# Patient Record
Sex: Female | Born: 1945 | Race: White | Hispanic: No | Smoking: Never smoker
Health system: Southern US, Community
[De-identification: ages and names within clinical notes are randomized; demographics above are authoritative.]

## PROBLEM LIST (undated history)

## (undated) DIAGNOSIS — E785 Hyperlipidemia, unspecified: Secondary | ICD-10-CM

## (undated) DIAGNOSIS — Q782 Osteopetrosis: Secondary | ICD-10-CM

## (undated) DIAGNOSIS — U071 COVID-19: Secondary | ICD-10-CM

## (undated) DIAGNOSIS — N814 Uterovaginal prolapse, unspecified: Secondary | ICD-10-CM

## (undated) DIAGNOSIS — M21619 Bunion of unspecified foot: Secondary | ICD-10-CM

## (undated) HISTORY — DX: Bunion of unspecified foot: M21.619

## (undated) HISTORY — DX: Uterovaginal prolapse, unspecified: N81.4

## (undated) HISTORY — DX: Hyperlipidemia, unspecified: E78.5

## (undated) HISTORY — PX: BREAST BIOPSY: SHX20

## (undated) HISTORY — PX: TUBAL LIGATION: SHX77

## (undated) HISTORY — PX: CATARACT EXTRACTION: SUR2

---

## 2014-08-30 ENCOUNTER — Ambulatory Visit (INDEPENDENT_AMBULATORY_CARE_PROVIDER_SITE_OTHER): Payer: Medicare PPO | Admitting: Physician Assistant

## 2014-08-30 ENCOUNTER — Encounter: Payer: Self-pay | Admitting: Physician Assistant

## 2014-08-30 VITALS — BP 125/75 | Ht 64.0 in | Wt 131.0 lb

## 2014-08-30 DIAGNOSIS — Z13820 Encounter for screening for osteoporosis: Secondary | ICD-10-CM

## 2014-08-30 DIAGNOSIS — N8110 Cystocele, unspecified: Secondary | ICD-10-CM | POA: Diagnosis not present

## 2014-08-30 DIAGNOSIS — M2012 Hallux valgus (acquired), left foot: Secondary | ICD-10-CM | POA: Diagnosis not present

## 2014-08-30 DIAGNOSIS — M1990 Unspecified osteoarthritis, unspecified site: Secondary | ICD-10-CM | POA: Diagnosis not present

## 2014-08-30 DIAGNOSIS — Z1239 Encounter for other screening for malignant neoplasm of breast: Secondary | ICD-10-CM | POA: Diagnosis not present

## 2014-08-30 DIAGNOSIS — IMO0002 Reserved for concepts with insufficient information to code with codable children: Secondary | ICD-10-CM | POA: Insufficient documentation

## 2014-08-30 DIAGNOSIS — M8580 Other specified disorders of bone density and structure, unspecified site: Secondary | ICD-10-CM

## 2014-08-30 DIAGNOSIS — M21612 Bunion of left foot: Secondary | ICD-10-CM

## 2014-08-30 NOTE — Patient Instructions (Addendum)
caltrate-D  About Cystocele  Overview  The pelvic organs, including the bladder, are normally supported by pelvic floor muscles and ligaments.  When these muscles and ligaments are stretched, weakened or torn, the wall between the bladder and the vagina sags or herniates causing a prolapse, sometimes called a cystocele.  This condition may cause discomfort and problems with emptying the bladder.  It can be present in various stages.  Some people are not aware of the changes.  Others may notice changes at the vaginal opening or a feeling of the bladder dropping outside the body.  Causes of a Cystocele  A cystocele is usually caused by muscle straining or stretching during childbirth.  In addition, cystocele is more common after menopause, because the hormone estrogen helps keep the elastic tissues around the pelvic organs strong.  A cystocele is more likely to occur when levels of estrogen decrease.  Other causes include: heavy lifting, chronic coughing, previous pelvic surgery and obesity.  Symptoms  A bladder that has dropped from its normal position may cause: unwanted urine leakage (stress incontinence), frequent urination or urge to urinate, incomplete emptying of the bladder (not feeling bladder relief after emptying), pain or discomfort in the vagina, pelvis, groin, lower back or lower abdomen and frequent urinary tract infections.  Mild cases may not cause any symptoms.  Treatment Options  Pelvic floor (Kegel) exercises:  Strength training the muscles in your genital area  Behavioral changes: Treating and preventing constipation, taking time to empty your bladder properly, learning to lift properly and/or avoid heavy lifting when possible, stopping smoking, avoiding weight gain and treating a chronic cough or bronchitis.  A pessary: A vaginal support device is sometimes used to help pelvic support caused by muscle and ligament changes.  Surgery: Surgical repair may be necessary if  symptoms cannot be managed with exercise, behavioral changes and a pessary.  Surgery is usually considered for severe cases.   2007, [employer]  Herpes Zoster Virus Vaccine What is this medicine? HERPES ZOSTER VIRUS VACCINE (HUR peez ZOS ter vahy ruhs vak SEEN) is a vaccine. It is used to prevent shingles in adults 67 years old and over. This vaccine is not used to treat shingles or nerve pain from shingles. This medicine may be used for other purposes; ask your health care provider or pharmacist if you have questions. COMMON BRAND NAME(S): Varivax, Zostavax What should I tell my health care provider before I take this medicine? They need to know if you have any of these conditions: -cancer like leukemia or lymphoma -immune system problems or therapy -infection with fever -tuberculosis -an unusual or allergic reaction to vaccines, neomycin, gelatin, other medicines, foods, dyes, or preservatives -pregnant or trying to get pregnant -breast-feeding How should I use this medicine? This vaccine is for injection under the skin. It is given by a health care professional. Talk to your pediatrician regarding the use of this medicine in children. This medicine is not approved for use in children. Overdosage: If you think you have taken too much of this medicine contact a poison control center or emergency room at once. NOTE: This medicine is only for you. Do not share this medicine with others. What if I miss a dose? This does not apply. What may interact with this medicine? Do not take this medicine with any of the following medications: -adalimumab -anakinra -etanercept -infliximab -medicines to treat cancer -medicines that suppress your immune system This medicine may also interact with the following medications: -immunoglobulins -steroid medicines  like prednisone or cortisone This list may not describe all possible interactions. Give your health care provider a list of  all the medicines, herbs, non-prescription drugs, or dietary supplements you use. Also tell them if you smoke, drink alcohol, or use illegal drugs. Some items may interact with your medicine. What should I watch for while using this medicine? Visit your doctor for regular check ups. This vaccine, like all vaccines, may not fully protect everyone. After receiving this vaccine it may be possible to pass chickenpox infection to others. Avoid people with immune system problems, pregnant women who have not had chickenpox, and newborns of women who have not had chickenpox. Talk to your doctor for more information. What side effects may I notice from receiving this medicine? Side effects that you should report to your doctor or health care professional as soon as possible: -allergic reactions like skin rash, itching or hives, swelling of the face, lips, or tongue -breathing problems -feeling faint or lightheaded, falls -fever, flu-like symptoms -pain, tingling, numbness in the hands or feet -swelling of the ankles, feet, hands -unusually weak or tired Side effects that usually do not require medical attention (report to your doctor or health care professional if they continue or are bothersome): -aches or pains -chickenpox-like rash -diarrhea -headache -loss of appetite -nausea, vomiting -redness, pain, swelling at site where injected -runny nose This list may not describe all possible side effects. Call your doctor for medical advice about side effects. You may report side effects to FDA at 1-800-FDA-1088. Where should I keep my medicine? This drug is given in a hospital or clinic and will not be stored at home. NOTE: This sheet is a summary. It may not cover all possible information. If you have questions about this medicine, talk to your doctor, pharmacist, or health care provider.  2015, [employer]. (2009-11-06 17:43:50) Pneumococcal Vaccine, Polyvalent suspension for  injection What is this medicine? PNEUMOCOCCAL VACCINE, POLYVALENT (NEU mo KOK al vak SEEN, pol ee VEY luhnt) is a vaccine to prevent pneumococcus bacteria infection. These bacteria are a major cause of ear infections, 'Strep throat' infections, and serious pneumonia, meningitis, or blood infections worldwide. These vaccines help the body to produce antibodies (protective substances) that help your body defend against these bacteria. This vaccine is recommended for infants and young children. This vaccine will not treat an infection. This medicine may be used for other purposes; ask your health care provider or pharmacist if you have questions. COMMON BRAND NAME(S): Prevnar 13 What should I tell my health care provider before I take this medicine? They need to know if you have any of these conditions: -bleeding problems -fever -immune system problems -low platelet count in the blood -seizures -an unusual or allergic reaction to pneumococcal vaccine, diphtheria toxoid, other vaccines, latex, other medicines, foods, dyes, or preservatives -pregnant or trying to get pregnant -breast-feeding How should I use this medicine? This vaccine is for injection into a muscle. It is given by a health care professional. A copy of Vaccine Information Statements will be given before each vaccination. Read this sheet carefully each time. The sheet may change frequently. Talk to your pediatrician regarding the use of this medicine in children. While this drug may be prescribed for children as young as 72 weeks old for selected conditions, precautions do apply. Overdosage: If you think you have taken too much of this medicine contact a poison control center or emergency room at once. NOTE: This medicine is only for you. Do not share  this medicine with others. What if I miss a dose? It is important not to miss your dose. Call your doctor or health care professional if you are unable to keep an appointment. What may  interact with this medicine? -medicines for cancer chemotherapy -medicines that suppress your immune function -medicines that treat or prevent blood clots like warfarin, enoxaparin, and dalteparin -steroid medicines like prednisone or cortisone This list may not describe all possible interactions. Give your health care provider a list of all the medicines, herbs, non-prescription drugs, or dietary supplements you use. Also tell them if you smoke, drink alcohol, or use illegal drugs. Some items may interact with your medicine. What should I watch for while using this medicine? Mild fever and pain should go away in 3 days or less. Report any unusual symptoms to your doctor or health care professional. What side effects may I notice from receiving this medicine? Side effects that you should report to your doctor or health care professional as soon as possible: -allergic reactions like skin rash, itching or hives, swelling of the face, lips, or tongue -breathing problems -confused -fever over 102 degrees F -pain, tingling, numbness in the hands or feet -seizures -unusual bleeding or bruising -unusual muscle weakness Side effects that usually do not require medical attention (report to your doctor or health care professional if they continue or are bothersome): -aches and pains -diarrhea -fever of 102 degrees F or less -headache -irritable -loss of appetite -pain, tender at site where injected -trouble sleeping This list may not describe all possible side effects. Call your doctor for medical advice about side effects. You may report side effects to FDA at 1-800-FDA-1088. Where should I keep my medicine? This does not apply. This vaccine is given in a clinic, pharmacy, doctor's office, or other health care setting and will not be stored at home. NOTE: This sheet is a summary. It may not cover all possible information. If you have questions about this medicine, talk to your doctor,  pharmacist, or health care provider.  2015, [employer]. (2008-08-02 10:17:22)

## 2014-09-02 ENCOUNTER — Telehealth: Payer: Self-pay | Admitting: Physician Assistant

## 2014-09-02 DIAGNOSIS — M21612 Bunion of left foot: Secondary | ICD-10-CM | POA: Insufficient documentation

## 2014-09-02 NOTE — Progress Notes (Signed)
   Subjective:    Patient ID: Connie Burke, female    DOB: December 21, 1945, 70 y.o.   MRN: [id number]  HPI Patient is a 69 year old female who presents to the clinic to establish care today.  .. Active Ambulatory Problems    Diagnosis Date Noted  . Cystocele 08/30/2014  . Bunion, left foot 09/02/2014   Resolved Ambulatory Problems    Diagnosis Date Noted  . No Resolved Ambulatory Problems   No Additional Past Medical History   .Marland Kitchen Family History  Problem Relation Age of Onset  . Hypertension Mother   . Arthritis Mother   . Cancer Maternal Aunt     breast   .. History   Social History  . Marital Status: Single    Spouse Name: N/A  . Number of Children: N/A  . Years of Education: N/A   Occupational History  . Not on file.   Social History Main Topics  . Smoking status: Never Smoker   . Smokeless tobacco: Not on file  . Alcohol Use: Not on file  . Drug Use: Not on file  . Sexual Activity: Not on file   Other Topics Concern  . Not on file   Social History Narrative  . No narrative on file      Review of Systems  All other systems reviewed and are negative.      Objective:   Physical Exam  Constitutional: She is oriented to person, place, and time. She appears well-developed and well-nourished.  HENT:  Head: Normocephalic and atraumatic.  Cardiovascular: Normal rate, regular rhythm and normal heart sounds.   Pulmonary/Chest: Effort normal and breath sounds normal. She has no wheezes.  Musculoskeletal:  Bunion of left great toe.   Neurological: She is alert and oriented to person, place, and time.  Skin: Skin is dry.  Psychiatric: She has a normal mood and affect. Her behavior is normal.          Assessment & Plan:  Cystocele-patient is unable exercises would help tremendously. I discussed with her the next option is a pessary device. She is unsure if she'll still go this route. I did also discuss that sometimes they will do surgery for this  condition. Gave her handout. Patient was advised to call if she would like a GYN referral for any of these options. If not in the meantime work on her Cagle's daily.  Left bunion-patient would eventually like this removed however she would like to wait till the end of the summer since she's can be very busy and active. She will eventually want referral for this matter. In the meantime wear sandals and shoes that properly fit not to rub on the left bunion.  Osteopenia-patient reports that she's been told she has osteopenia in the past. She is currently not on any medications or supplements. I highly encouraged her to start a calcium and vitamin D supplement. Will check bone density.  I did go ahead and order a bone density and mammogram for patient. She is aware that she does need fasting labs in complete physical done. She will schedule for this.  Did discuss health maintenance items such as pneumonia vaccine in Zostavax. Will try to locate records to make sure she has not had either one of these. Did give her information on this.

## 2014-09-02 NOTE — Telephone Encounter (Signed)
Call pt: I could not remember if you wanted to hold off on referral to GYN for cystocele or me to go ahead and make?

## 2014-09-06 ENCOUNTER — Telehealth: Payer: Self-pay | Admitting: Physician Assistant

## 2014-09-06 ENCOUNTER — Other Ambulatory Visit: Payer: Self-pay | Admitting: Physician Assistant

## 2014-09-06 DIAGNOSIS — IMO0002 Reserved for concepts with insufficient information to code with codable children: Secondary | ICD-10-CM

## 2014-09-06 NOTE — Telephone Encounter (Signed)
Pt called. She has not heard anything from [place] regarding referral to Gynecology.  Thank you.

## 2014-09-06 NOTE — Telephone Encounter (Signed)
Referral made. Call if not heard in next week.

## 2014-09-06 NOTE — Telephone Encounter (Signed)
She would like [place] to send it now.

## 2014-09-14 ENCOUNTER — Ambulatory Visit (INDEPENDENT_AMBULATORY_CARE_PROVIDER_SITE_OTHER): Payer: Medicare PPO

## 2014-09-14 DIAGNOSIS — M810 Age-related osteoporosis without current pathological fracture: Secondary | ICD-10-CM

## 2014-09-14 DIAGNOSIS — Z1231 Encounter for screening mammogram for malignant neoplasm of breast: Secondary | ICD-10-CM | POA: Diagnosis not present

## 2014-09-15 ENCOUNTER — Encounter: Payer: Self-pay | Admitting: Physician Assistant

## 2014-09-15 DIAGNOSIS — M810 Age-related osteoporosis without current pathological fracture: Secondary | ICD-10-CM | POA: Insufficient documentation

## 2014-09-21 ENCOUNTER — Encounter: Payer: Self-pay | Admitting: Physician Assistant

## 2014-09-21 ENCOUNTER — Ambulatory Visit (INDEPENDENT_AMBULATORY_CARE_PROVIDER_SITE_OTHER): Payer: Medicare PPO | Admitting: Physician Assistant

## 2014-09-21 ENCOUNTER — Encounter: Payer: Self-pay | Admitting: Obstetrics & Gynecology

## 2014-09-21 ENCOUNTER — Ambulatory Visit (INDEPENDENT_AMBULATORY_CARE_PROVIDER_SITE_OTHER): Payer: Medicare PPO | Admitting: Obstetrics & Gynecology

## 2014-09-21 VITALS — BP 115/77 | HR 67 | Ht 64.0 in | Wt 133.0 lb

## 2014-09-21 VITALS — BP 131/67 | HR 67 | Resp 16 | Ht 64.0 in | Wt 133.0 lb

## 2014-09-21 DIAGNOSIS — M810 Age-related osteoporosis without current pathological fracture: Secondary | ICD-10-CM | POA: Diagnosis not present

## 2014-09-21 DIAGNOSIS — N8110 Cystocele, unspecified: Secondary | ICD-10-CM | POA: Diagnosis not present

## 2014-09-21 DIAGNOSIS — M2012 Hallux valgus (acquired), left foot: Secondary | ICD-10-CM

## 2014-09-21 DIAGNOSIS — M21612 Bunion of left foot: Secondary | ICD-10-CM

## 2014-09-21 DIAGNOSIS — IMO0002 Reserved for concepts with insufficient information to code with codable children: Secondary | ICD-10-CM

## 2014-09-21 MED ORDER — DICLOFENAC SODIUM 2 % TD SOLN: TRANSDERMAL | Status: DC

## 2014-09-21 NOTE — Patient Instructions (Signed)
Zoledronic Acid injection ( Osteoporosis) What is this medicine? ZOLEDRONIC ACID (ZOE le dron ik AS id) lowers the amount of calcium loss from bone. It is used to treat Paget's disease and osteoporosis in women. This medicine may be used for other purposes; ask your health care provider or pharmacist if you have questions. COMMON BRAND NAME(S): Reclast, Zometa What should I tell my health care provider before I take this medicine? They need to know if you have any of these conditions: -aspirin-sensitive asthma -cancer, especially if you are receiving medicines used to treat cancer -dental disease or wear dentures -infection -kidney disease -low levels of calcium in the blood -past surgery on the parathyroid gland or intestines -receiving corticosteroids like dexamethasone or prednisone -an unusual or allergic reaction to zoledronic acid, other medicines, foods, dyes, or preservatives -pregnant or trying to get pregnant -breast-feeding How should I use this medicine? This medicine is for infusion into a vein. It is given by a health care professional in a hospital or clinic setting. Talk to your pediatrician regarding the use of this medicine in children. This medicine is not approved for use in children. Overdosage: If you think you have taken too much of this medicine contact a poison control center or emergency room at once. NOTE: This medicine is only for you. Do not share this medicine with others. What if I miss a dose? It is important not to miss your dose. Call your doctor or health care professional if you are unable to keep an appointment. What may interact with this medicine? -certain antibiotics given by injection -NSAIDs, medicines for pain and inflammation, like ibuprofen or naproxen -some diuretics like bumetanide, furosemide -teriparatide This list may not describe all possible interactions. Give your health care provider a list of all the medicines, herbs,  non-prescription drugs, or dietary supplements you use. Also tell them if you smoke, drink alcohol, or use illegal drugs. Some items may interact with your medicine. What should I watch for while using this medicine? Visit your doctor or health care professional for regular checkups. It may be some time before you see the benefit from this medicine. Do not stop taking your medicine unless your doctor tells you to. Your doctor may order blood tests or other tests to see how you are doing. Women should inform their doctor if they wish to become pregnant or think they might be pregnant. There is a potential for serious side effects to an unborn child. Talk to your health care professional or pharmacist for more information. You should make sure that you get enough calcium and vitamin D while you are taking this medicine. Discuss the foods you eat and the vitamins you take with your health care professional. Some people who take this medicine have severe bone, joint, and/or muscle pain. This medicine may also increase your risk for jaw problems or a broken thigh bone. Tell your doctor right away if you have severe pain in your jaw, bones, joints, or muscles. Tell your doctor if you have any pain that does not go away or that gets worse. Tell your dentist and dental surgeon that you are taking this medicine. You should not have major dental surgery while on this medicine. See your dentist to have a dental exam and fix any dental problems before starting this medicine. Take good care of your teeth while on this medicine. Make sure you see your dentist for regular follow-up appointments. What side effects may I notice from receiving this medicine? Side  effects that you should report to your doctor or health care professional as soon as possible: -allergic reactions like skin rash, itching or hives, swelling of the face, lips, or tongue -anxiety, confusion, or depression -breathing problems -changes in  vision -eye pain -feeling faint or lightheaded, falls -jaw pain, especially after dental work -mouth sores -muscle cramps, stiffness, or weakness -trouble passing urine or change in the amount of urine Side effects that usually do not require medical attention (report to your doctor or health care professional if they continue or are bothersome): -bone, joint, or muscle pain -constipation -diarrhea -fever -hair loss -irritation at site where injected -loss of appetite -nausea, vomiting -stomach upset -trouble sleeping -trouble swallowing -weak or tired This list may not describe all possible side effects. Call your doctor for medical advice about side effects. You may report side effects to FDA at 1-800-FDA-1088. Where should I keep my medicine? This drug is given in a hospital or clinic and will not be stored at home. NOTE: This sheet is a summary. It may not cover all possible information. If you have questions about this medicine, talk to your doctor, pharmacist, or health care provider.  2015, Elsevier/Gold Standard. (2012-11-02 10:03:48) Osteoporosis Throughout your life, your body breaks down old bone and replaces it with new bone. As you get older, your body does not replace bone as quickly as it breaks it down. By the age of 70 years, most people begin to gradually lose bone because of the imbalance between bone loss and replacement. Some people lose more bone than others. Bone loss beyond a specified normal degree is considered osteoporosis.  Osteoporosis affects the strength and durability of your bones. The inside of the ends of your bones and your flat bones, like the bones of your pelvis, look like honeycomb, filled with tiny open spaces. As bone loss occurs, your bones become less dense. This means that the open spaces inside your bones become bigger and the walls between these spaces become thinner. This makes your bones weaker. Bones of a person with osteoporosis can  become so weak that they can break (fracture) during minor accidents, such as a simple fall. CAUSES  The following factors have been associated with the development of osteoporosis:  Smoking.  Drinking more than 2 alcoholic drinks several days per week.  Long-term use of certain medicines:  Corticosteroids.  Chemotherapy medicines.  Thyroid medicines.  Antiepileptic medicines.  Gonadal hormone suppression medicine.  Immunosuppression medicine.  Being underweight.  Lack of physical activity.  Lack of exposure to the sun. This can lead to vitamin D deficiency.  Certain medical conditions:  Certain inflammatory bowel diseases, such as Crohn disease and ulcerative colitis.  Diabetes.  Hyperthyroidism.  Hyperparathyroidism. RISK FACTORS Anyone can develop osteoporosis. However, the following factors can increase your risk of developing osteoporosis:  Gender--Women are at higher risk than men.  Age--Being older than 50 years increases your risk.  Ethnicity--White and Asian people have an increased risk.  Weight --Being extremely underweight can increase your risk of osteoporosis.  Family history of osteoporosis--Having a family member who has developed osteoporosis can increase your risk. SYMPTOMS  Usually, people with osteoporosis have no symptoms.  DIAGNOSIS  Signs during a physical exam that may prompt your caregiver to suspect osteoporosis include:  Decreased height. This is usually caused by the compression of the bones that form your spine (vertebrae) because they have weakened and become fractured.  A curving or rounding of the upper back (kyphosis). To confirm  signs of osteoporosis, your caregiver may request a procedure that uses 2 low-dose X-ray beams with different levels of energy to measure your bone mineral density (dual-energy X-ray absorptiometry [DXA]). Also, your caregiver may check your level of vitamin D. TREATMENT  The goal of osteoporosis  treatment is to strengthen bones in order to decrease the risk of bone fractures. There are different types of medicines available to help achieve this goal. Some of these medicines work by slowing the processes of bone loss. Some medicines work by increasing bone density. Treatment also involves making sure that your levels of calcium and vitamin D are adequate. PREVENTION  There are things you can do to help prevent osteoporosis. Adequate intake of calcium and vitamin D can help you achieve optimal bone mineral density. Regular exercise can also help, especially resistance and weight-bearing activities. If you smoke, quitting smoking is an important part of osteoporosis prevention. MAKE SURE YOU:  Understand these instructions.  Will watch your condition.  Will get help right away if you are not doing well or get worse. FOR MORE INFORMATION www.osteo.org and [employer].com.ee Document Released: 02/27/2005 Document Revised: 09/14/2012 Document Reviewed: 05/04/2011 [facility] Patient Information 2015 [place], [place]. This information is not intended to replace advice given to you by your health care provider. Make sure you discuss any questions you have with your health care provider.   Denosumab injection What is this medicine? DENOSUMAB (den oh sue mab) slows bone breakdown. Prolia is used to treat osteoporosis in women after menopause and in men. Delton See is used to prevent bone fractures and other bone problems caused by cancer bone metastases. Delton See is also used to treat giant cell tumor of the bone. This medicine may be used for other purposes; ask your health care provider or pharmacist if you have questions. COMMON BRAND NAME(S): Prolia, XGEVA What should I tell my health care provider before I take this medicine? They need to know if you have any of these conditions: -dental disease -eczema -infection or history of infections -kidney disease or on dialysis -low blood calcium or vitamin  D -malabsorption syndrome -scheduled to have surgery or tooth extraction -taking medicine that contains denosumab -thyroid or parathyroid disease -an unusual reaction to denosumab, other medicines, foods, dyes, or preservatives -pregnant or trying to get pregnant -breast-feeding How should I use this medicine? This medicine is for injection under the skin. It is given by a health care professional in a hospital or clinic setting. If you are getting Prolia, a special MedGuide will be given to you by the pharmacist with each prescription and refill. Be sure to read this information carefully each time. For Prolia, talk to your pediatrician regarding the use of this medicine in children. Special care may be needed. For Delton See, talk to your pediatrician regarding the use of this medicine in children. While this drug may be prescribed for children as young as 13 years for selected conditions, precautions do apply. Overdosage: If you think you've taken too much of this medicine contact a poison control center or emergency room at once. Overdosage: If you think you have taken too much of this medicine contact a poison control center or emergency room at once. NOTE: This medicine is only for you. Do not share this medicine with others. What if I miss a dose? It is important not to miss your dose. Call your doctor or health care professional if you are unable to keep an appointment. What may interact with this medicine? Do not take this  medicine with any of the following medications: -other medicines containing denosumab This medicine may also interact with the following medications: -medicines that suppress the immune system -medicines that treat cancer -steroid medicines like prednisone or cortisone This list may not describe all possible interactions. Give your health care provider a list of all the medicines, herbs, non-prescription drugs, or dietary supplements you use. Also tell them if you  smoke, drink alcohol, or use illegal drugs. Some items may interact with your medicine. What should I watch for while using this medicine? Visit your doctor or health care professional for regular checks on your progress. Your doctor or health care professional may order blood tests and other tests to see how you are doing. Call your doctor or health care professional if you get a cold or other infection while receiving this medicine. Do not treat yourself. This medicine may decrease your body's ability to fight infection. You should make sure you get enough calcium and vitamin D while you are taking this medicine, unless your doctor tells you not to. Discuss the foods you eat and the vitamins you take with your health care professional. See your dentist regularly. Brush and floss your teeth as directed. Before you have any dental work done, tell your dentist you are receiving this medicine. Do not become pregnant while taking this medicine or for 5 months after stopping it. Women should inform their doctor if they wish to become pregnant or think they might be pregnant. There is a potential for serious side effects to an unborn child. Talk to your health care professional or pharmacist for more information. What side effects may I notice from receiving this medicine? Side effects that you should report to your doctor or health care professional as soon as possible: -allergic reactions like skin rash, itching or hives, swelling of the face, lips, or tongue -breathing problems -chest pain -fast, irregular heartbeat -feeling faint or lightheaded, falls -fever, chills, or any other sign of infection -muscle spasms, tightening, or twitches -numbness or tingling -skin blisters or bumps, or is dry, peels, or red -slow healing or unexplained pain in the mouth or jaw -unusual bleeding or bruising Side effects that usually do not require medical attention (Report these to your doctor or health care  professional if they continue or are bothersome.): -muscle pain -stomach upset, gas This list may not describe all possible side effects. Call your doctor for medical advice about side effects. You may report side effects to FDA at 1-800-FDA-1088. Where should I keep my medicine? This medicine is only given in a clinic, doctor's office, or other health care setting and will not be stored at home. NOTE: This sheet is a summary. It may not cover all possible information. If you have questions about this medicine, talk to your doctor, pharmacist, or health care provider.  2015, Elsevier/Gold Standard. (2011-11-18 12:37:47) Alendronate tablets What is this medicine? ALENDRONATE (a LEN droe nate) slows calcium loss from bones. It helps to make normal healthy bone and to slow bone loss in people with Paget's disease and osteoporosis. It may be used in others at risk for bone loss. This medicine may be used for other purposes; ask your health care provider or pharmacist if you have questions. COMMON BRAND NAME(S): Fosamax What should I tell my health care provider before I take this medicine? They need to know if you have any of these conditions: -dental disease -esophagus, stomach, or intestine problems, like acid reflux or GERD -kidney disease -low blood  calcium -low vitamin D -problems sitting or standing 30 minutes -trouble swallowing -an unusual or allergic reaction to alendronate, other medicines, foods, dyes, or preservatives -pregnant or trying to get pregnant -breast-feeding How should I use this medicine? You must take this medicine exactly as directed or you will lower the amount of the medicine you absorb into your body or you may cause yourself harm. Take this medicine by mouth first thing in the morning, after you are up for the day. Do not eat or drink anything before you take your medicine. Swallow the tablet with a full glass (6 to 8 fluid ounces) of plain water. Do not take this  medicine with any other drink. Do not chew or crush the tablet. After taking this medicine, do not eat breakfast, drink, or take any medicines or vitamins for at least 30 minutes. Sit or stand up for at least 30 minutes after you take this medicine; do not lie down. Do not take your medicine more often than directed. Talk to your pediatrician regarding the use of this medicine in children. Special care may be needed. Overdosage: If you think you have taken too much of this medicine contact a poison control center or emergency room at once. NOTE: This medicine is only for you. Do not share this medicine with others. What if I miss a dose? If you miss a dose, do not take it later in the day. Continue your normal schedule starting the next morning. Do not take double or extra doses. What may interact with this medicine? -aluminum hydroxide -antacids -aspirin -calcium supplements -drugs for inflammation like ibuprofen, naproxen, and others -iron supplements -magnesium supplements -vitamins with minerals This list may not describe all possible interactions. Give your health care provider a list of all the medicines, herbs, non-prescription drugs, or dietary supplements you use. Also tell them if you smoke, drink alcohol, or use illegal drugs. Some items may interact with your medicine. What should I watch for while using this medicine? Visit your doctor or health care professional for regular checks ups. It may be some time before you see benefit from this medicine. Do not stop taking your medicine except on your doctor's advice. Your doctor or health care professional may order blood tests and other tests to see how you are doing. You should make sure you get enough calcium and vitamin D while you are taking this medicine, unless your doctor tells you not to. Discuss the foods you eat and the vitamins you take with your health care professional. Some people who take this medicine have severe bone,  joint, and/or muscle pain. This medicine may also increase your risk for a broken thigh bone. Tell your doctor right away if you have pain in your upper leg or groin. Tell your doctor if you have any pain that does not go away or that gets worse. This medicine can make you more sensitive to the sun. If you get a rash while taking this medicine, sunlight may cause the rash to get worse. Keep out of the sun. If you cannot avoid being in the sun, wear protective clothing and use sunscreen. Do not use sun lamps or tanning beds/booths. What side effects may I notice from receiving this medicine? Side effects that you should report to your doctor or health care professional as soon as possible: -allergic reactions like skin rash, itching or hives, swelling of the face, lips, or tongue -black or tarry stools -bone, muscle or joint pain -changes in vision -  chest pain -heartburn or stomach pain -jaw pain, especially after dental work -pain or trouble when swallowing -redness, blistering, peeling or loosening of the skin, including inside the mouth Side effects that usually do not require medical attention (report to your doctor or health care professional if they continue or are bothersome): -changes in taste -diarrhea or constipation -eye pain or itching -headache -nausea or vomiting -stomach gas or fullness This list may not describe all possible side effects. Call your doctor for medical advice about side effects. You may report side effects to FDA at 1-800-FDA-1088. Where should I keep my medicine? Keep out of the reach of children. Store at room temperature of 15 and 30 degrees C (59 and 86 degrees F). Throw away any unused medicine after the expiration date. NOTE: This sheet is a summary. It may not cover all possible information. If you have questions about this medicine, talk to your doctor, pharmacist, or health care provider.  2015, Elsevier/Gold Standard. (2010-11-16 08:56:09)

## 2014-09-21 NOTE — Progress Notes (Signed)
   Subjective:    Patient ID: Connie Burke, female    DOB: 05/02/1946, 69 y.o.   MRN: [id number]  HPI  69 yo DW [phone] 69 yo kids, 5 grands) here today to discuss a cystocele, present for 2 years. She will occasionally have some "irritation from it". Doing Kegels. Worse with sitting. She denies any urinary incontinence. She has been abstinent for about 2 years. Review of Systems All vaginal deliveries.    Objective:   Physical Exam  Marked vulvovaginal atrophy 2-3rd degree cystocele 1st degree rectocele No uterine desensus No pelvic masses on exam  I spent at least 30 mins disucssing options. She wanted to try a pessary. We tried various options. A 2 inch ring worked but she was unable to remove it.      Assessment & Plan:  Cystocele- She will continue Kegel's at this time. If she decides that she wants cystocele repair, then she will contact me.

## 2014-09-22 NOTE — Progress Notes (Signed)
   Subjective:    Patient ID: Connie Burke, female    DOB: 09/13/1945, 69 y.o.   MRN: [id number]  HPI Patient is a 69 year old female who presents to the clinic to discuss bone density results. She is currently not on any supplements or vitamin D or calcium or treatment for osteoporosis.  Patient also would like something to treat left great toe hallux valgus. She really loves to walk and exercise. If she does this for long periods of time there is some pain and inflammation of his foot. She knows that surgery is needed but would like to put this off until the fall.   Review of Systems  All other systems reviewed and are negative.      Objective:   Physical Exam  Musculoskeletal:  Hallux valgus noticed over left great toe. There is some tenderness to palpation however no erythema or swelling.          Assessment & Plan:  Osteoporosis-I personally spent greater than 30 minutes counseling patient on bone density numbers and risk factors for not treating. Patient is currently not even taking a vitamin D/calcium supplement. We did start this today. Discuss for patient to at least take vitamin D3 over-the-counter 1000 units daily with calcium 1200 mg daily. It was counseled with patient to take vitamin D with a fattier meal to increase absorption. Patient aware that calcium absorbs better in smaller increments. Encouraged food with calcium in it. Patient is very resistant to taking osteoporotic drugs due to side effects. We discussed oral and injectable options for bone density treatment. Patient is not willing to start those today. I did give her an extensive handout with information. We can certainly recheck her bone density in 2 years after starting the vitamin D calcium supplementation and see if there's any improvement. Also discussed the importance of weight training to increase bone density.  Hallux valgus-certainly encouraged patient to wear shoes that did not compress her left great  toe. Patient has a history of acid reflux that is easily exacerbated. She does not want to and I do not think she is a candidate for oral anti-inflammatories. I did give her a prescription for NSAID topical anti-inflammatory to place on the toe 2-4 times a day or at least with exercise. She will try this and see if this helps. Certainly this should be able to get her through the summer until she can have surgery.  Addendum; patient's insurance would not cover NSAID. We did give her a few boxes of samples in the office today. She can certainly try this and if it's improving we can either keep her in samples until her surgery or try a generic that could be covered by insurance.

## 2014-10-05 ENCOUNTER — Telehealth: Payer: Self-pay | Admitting: *Deleted

## 2014-10-05 ENCOUNTER — Other Ambulatory Visit: Payer: Self-pay | Admitting: Physician Assistant

## 2014-10-05 ENCOUNTER — Other Ambulatory Visit: Payer: Self-pay | Admitting: *Deleted

## 2014-10-05 DIAGNOSIS — M21612 Bunion of left foot: Secondary | ICD-10-CM

## 2014-10-05 MED ORDER — DICLOFENAC SODIUM 1.5 % TD SOLN: TRANSDERMAL | Status: DC

## 2014-10-05 NOTE — Telephone Encounter (Signed)
Pt wants a referral to a foot surgeon.

## 2014-10-05 NOTE — Telephone Encounter (Signed)
Ok referral placed.

## 2014-11-28 ENCOUNTER — Telehealth: Payer: Self-pay

## 2014-11-28 NOTE — Telephone Encounter (Signed)
Return patient voice message wanted to make appt. Left message for patient to call office.

## 2014-12-07 ENCOUNTER — Telehealth: Payer: Self-pay

## 2014-12-07 DIAGNOSIS — M21612 Bunion of left foot: Secondary | ICD-10-CM

## 2014-12-07 NOTE — Telephone Encounter (Signed)
Referral ordered for Bunion left foot.

## 2014-12-14 ENCOUNTER — Telehealth: Payer: Self-pay | Admitting: *Deleted

## 2014-12-14 ENCOUNTER — Ambulatory Visit (INDEPENDENT_AMBULATORY_CARE_PROVIDER_SITE_OTHER): Payer: Medicare PPO

## 2014-12-14 ENCOUNTER — Ambulatory Visit (INDEPENDENT_AMBULATORY_CARE_PROVIDER_SITE_OTHER): Payer: Medicare PPO | Admitting: Podiatry

## 2014-12-14 DIAGNOSIS — M79673 Pain in unspecified foot: Secondary | ICD-10-CM

## 2014-12-14 DIAGNOSIS — M205X9 Other deformities of toe(s) (acquired), unspecified foot: Secondary | ICD-10-CM

## 2014-12-14 NOTE — Patient Instructions (Signed)
Pre-Operative Instructions  Congratulations, you have decided to take an important step to improving your quality of life.  You can be assured that the doctors of [facility] will be with you every step of the way.  1. Plan to be at the surgery center/hospital at least 1 (one) hour prior to your scheduled time unless otherwise directed by the surgical center/hospital staff.  You must have a responsible adult accompany you, remain during the surgery and drive you home.  Make sure you have directions to the surgical center/hospital and know how to get there on time. 2. For hospital based surgery you will need to obtain a history and physical form from your family physician within 1 month prior to the date of surgery- we will give you a form for you primary physician.  3. We make every effort to accommodate the date you request for surgery.  There are however, times where surgery dates or times have to be moved.  We will contact you as soon as possible if a change in schedule is required.   4. No Aspirin/Ibuprofen for one week before surgery.  If you are on aspirin, any non-steroidal anti-inflammatory medications (Mobic, Aleve, Ibuprofen) you should stop taking it 7 days prior to your surgery.  You make take Tylenol  For pain prior to surgery.  5. Medications- If you are taking daily heart and blood pressure medications, seizure, reflux, allergy, asthma, anxiety, pain or diabetes medications, make sure the surgery center/hospital is aware before the day of surgery so they may notify you which medications to take or avoid the day of surgery. 6. No food or drink after midnight the night before surgery unless directed otherwise by surgical center/hospital staff. 7. No alcoholic beverages 24 hours prior to surgery.  No smoking 24 hours prior to or 24 hours after surgery. 8. Wear loose pants or shorts- loose enough to fit over bandages, boots, and casts. 9. No slip on shoes, sneakers are best. 10. Bring  your boot with you to the surgery center/hospital.  Also bring crutches or a walker if your physician has prescribed it for you.  If you do not have this equipment, it will be provided for you after surgery. 11. If you have not been contracted by the surgery center/hospital by the day before your surgery, call to confirm the date and time of your surgery. 12. Leave-time from work may vary depending on the type of surgery you have.  Appropriate arrangements should be made prior to surgery with your employer. 13. Prescriptions will be provided immediately following surgery by your doctor.  Have these filled as soon as possible after surgery and take the medication as directed. 14. Remove nail polish on the operative foot. 15. Wash the night before surgery.  The night before surgery wash the foot and leg well with the antibacterial soap provided and water paying special attention to beneath the toenails and in between the toes.  Rinse thoroughly with water and dry well with a towel.  Perform this wash unless told not to do so by your physician.  Enclosed: 1 Ice pack (please put in freezer the night before surgery)   1 Hibiclens skin cleaner   Pre-op Instructions  If you have any questions regarding the instructions, do not hesitate to call our office.  [place]: [address] [place], [place] [address] [phone]  [place]: [address] [place], [place] [address] [phone]  [place]: [address]  [place], [place] [address] [phone]  Dr. Richard   Tuchman DPM, Dr. Norman Regal DPM Dr. Richard Sikora DPM, Dr. M. Todd Hyatt DPM, Dr. Kathryn Egerton DPM 

## 2014-12-14 NOTE — Progress Notes (Addendum)
Subjective:     Patient ID: Connie Burke, female   DOB: [birth year], 69 y.o.   MRN: [id number]  HPI patient states I've had a lot of pain in my big toe joint of my left foot that's been present for several years and is worsened over the last few months and I've had previous injections in it which are no longer helpful for me and I've tried shoe gear modifications and ice with minimal relief of discomfort   Review of Systems  All other systems reviewed and are negative.      Objective:   Physical Exam  Constitutional: She is oriented to person, place, and time.  Cardiovascular: Intact distal pulses.   Musculoskeletal: Normal range of motion.  Neurological: She is oriented to person, place, and time.  Skin: Skin is warm.  Nursing note and vitals reviewed.  neurovascular status found to be intact with muscle strength adequate range of motion within normal limits. Patient's noted to have good digital perfusion is well oriented 3 and I noted significant limitation of motion first MPJ left with inflammation and fluid across the dorsal medial and lateral surface with pain upon palpation. I did not note current crepitus in the joint but I only noted 5-10 of dorsiflexion and 5 plantarflexion. There is also mild edema in the plantar aspect of the first metatarsal left but she states it does not hurt     Assessment:     Long-term hallux limitus condition left with inflammatory capsulitis around the joint surface and possibility of arthritic and cartilage changes    Plan:     H&P and x-rays reviewed with patient. Today I went ahead and I discussed treatment options both conservative and surgical. Patient has had this for several years and wants this fixed and at this time due to her already seen other physicians and knowing what she wants I did allow her to read consent form for correction of this deformity. Patient was allowed to read consent form was given all possible alternative treatments and  complications including the fact that there is no guarantee that this will fix the problem and long-term may require fusion or joint implantation and she understands all this wants surgery and signs consent form. She is scheduled for outpatient surgery and understands total recovery can be 6 months to one year and is dispensed air fracture walker with all instructions on usage which I want her to wear prior to surgery    Xray report:  Patient has signifcant spur of the left 1st metatarsal with a narrowing of the joint surface. There is also elevation of the angle of 15 degrees between the 1st and 2nd metatarsal bone and prominence on the medial side of the bone. This correlates with extreme pain patient is in

## 2014-12-14 NOTE — Progress Notes (Signed)
   Subjective:    Patient ID: Connie Burke, female    DOB: 01/01/1946, 69 y.o.   MRN: [id number]  HPI Pt presents with left pain at MPJ that radiates toward arch, this has been an ongoing problem for several months has become severe in nature over the past 2 weeks she is unable to walk and ambulate normally. She has had injections in the past that did not help. Ice helps, enclosed shoes worsen pain   Review of Systems  All other systems reviewed and are negative.      Objective:   Physical Exam        Assessment & Plan:

## 2014-12-15 NOTE — Telephone Encounter (Signed)
"  I'm scheduled to do surgery on 01/17/2015.  I was supposed to call and confirm.  I'd also like to know my portion of surgery.  I saw Dr. Paulla Dolly this morning.  Call me back, I'd appreciate it."  I called and left patient a message confirming that we do have her scheduled for surgery on 01/17/2015.  Call if you have any further questions.  I called to verify patients benefits and was informed by Karma Lew that patient has 100% coverage, no referral needed and no pre-cert required.  Reference number for call is [id number].

## 2014-12-19 ENCOUNTER — Telehealth: Payer: Self-pay | Admitting: *Deleted

## 2014-12-19 NOTE — Telephone Encounter (Signed)
Pt states she has surgery 01/17/2015 and would like to know how much she will owe.

## 2015-01-04 ENCOUNTER — Telehealth: Payer: Self-pay | Admitting: *Deleted

## 2015-01-04 NOTE — Telephone Encounter (Signed)
I'm scheduled for surgery on August 16.  I've called to see how much I will owe and what's my deductible."  You don't have a deductible.  It may be covered at 100%.  Normally Medicare covers 80% and you're responsible for 20%.  If that is the case, you would owe about $150.  "Okay, I understand now.  Thank you."  Keep in mind that this is only the physician fee.  You will have to contact the surgical center to get facility fee and anesthesia fee.  "How do I go about getting their information?"  Call them at [phone].

## 2015-01-16 ENCOUNTER — Other Ambulatory Visit: Payer: Self-pay

## 2015-01-16 ENCOUNTER — Telehealth: Payer: Self-pay | Admitting: *Deleted

## 2015-01-16 NOTE — Telephone Encounter (Signed)
I'm calling to let you know that they requested a x-ray report.  I just faxed it to them.  They said they stay open until 8pm.  Hopefully I will hear something in the morning about whether it's approved or not.  I will call you first thing in the morning.  "Okay, I don't see why it wouldn't be approved.  It's not like it's an elective.  Something has to be done.  I'll look for your call in the morning."

## 2015-01-17 ENCOUNTER — Other Ambulatory Visit: Payer: Self-pay | Admitting: *Deleted

## 2015-01-17 ENCOUNTER — Encounter: Payer: Self-pay | Admitting: Podiatry

## 2015-01-17 DIAGNOSIS — M2022 Hallux rigidus, left foot: Secondary | ICD-10-CM | POA: Diagnosis not present

## 2015-01-17 DIAGNOSIS — M205X2 Other deformities of toe(s) (acquired), left foot: Secondary | ICD-10-CM

## 2015-01-17 NOTE — Telephone Encounter (Signed)
I called and left Connie Burke at the surgical center a message that patient's surgery was authorized by [employer].  The authorization number is [id number].   I'm calling to let you know your surgery was approved.  You are still on for your procedure today.  "Okay great, I'm glad to hear.  I have a problem.  I lost my brush thing I'm supposed to use prior to surgery.  What should I do?  I lost it or someone may have thrown it away."  Just let them know when you get to the surgical center.

## 2015-01-20 ENCOUNTER — Telehealth: Payer: Self-pay | Admitting: *Deleted

## 2015-01-20 NOTE — Telephone Encounter (Signed)
Courtesy call, left message instructing pt to rest, ice and elevate to weight bear no more than 5-7 min/hour, remain in the boot at all times even to sleep and if resting not going to nap can open the boot and rotate the foot and ankle, but must remain in the boot.  I encourage pt to call with concerns, and reminded pt of 01/26/2015 appt at 0915am.

## 2015-01-23 ENCOUNTER — Other Ambulatory Visit: Payer: Self-pay

## 2015-01-26 ENCOUNTER — Other Ambulatory Visit: Payer: Self-pay | Admitting: Podiatry

## 2015-01-26 ENCOUNTER — Ambulatory Visit (INDEPENDENT_AMBULATORY_CARE_PROVIDER_SITE_OTHER): Payer: Medicare PPO

## 2015-01-26 ENCOUNTER — Ambulatory Visit (INDEPENDENT_AMBULATORY_CARE_PROVIDER_SITE_OTHER): Payer: Medicare PPO | Admitting: Podiatry

## 2015-01-26 VITALS — BP 123/73 | HR 74 | Temp 97.0°F | Resp 16

## 2015-01-26 DIAGNOSIS — Z9889 Other specified postprocedural states: Secondary | ICD-10-CM

## 2015-01-26 DIAGNOSIS — M205X9 Other deformities of toe(s) (acquired), unspecified foot: Secondary | ICD-10-CM

## 2015-01-27 NOTE — Progress Notes (Signed)
Connie Burke presents today for first postop visit date of surgery 01/17/2015. For follow-up [facility] bunion repair left foot. She denies fever chills nausea vomiting muscle aches and pains states that she happened to have bumped the toe.   Objective: vital signs are stable she is alert and oriented 3 dry sterile dressing presents with Cam Walker today. The dressing is dry and intact. Once removed demonstrates minimal edema no erythema cellulitis drainage or odor. Radiographs confirm [facility] bunion repair with double K wire fixation first metatarsal left. She has good range of motion of the first metatarsophalangeal joint without pain.   assessment: Well-healing surgical foot left.  Plan: Redressed the foot today with a dry sterile compressive dressing follow-up with primary surgeon in 1 week. Encouraged range of motion activities.   Roselind Messier DPM

## 2015-02-02 ENCOUNTER — Telehealth: Payer: Self-pay

## 2015-02-02 ENCOUNTER — Ambulatory Visit (INDEPENDENT_AMBULATORY_CARE_PROVIDER_SITE_OTHER): Payer: Medicare PPO | Admitting: Podiatry

## 2015-02-02 VITALS — BP 119/69 | HR 61 | Resp 16

## 2015-02-02 DIAGNOSIS — Z9889 Other specified postprocedural states: Secondary | ICD-10-CM | POA: Diagnosis not present

## 2015-02-02 DIAGNOSIS — M205X9 Other deformities of toe(s) (acquired), unspecified foot: Secondary | ICD-10-CM

## 2015-02-02 DIAGNOSIS — M21612 Bunion of left foot: Secondary | ICD-10-CM

## 2015-02-02 DIAGNOSIS — M2012 Hallux valgus (acquired), left foot: Secondary | ICD-10-CM

## 2015-02-03 NOTE — Progress Notes (Signed)
She presents today for her second postop visit. She is status post a biplanar [place] bunion repair performed 01/17/2015 by Dr. Paulla Dolly. She states that the foot is doing fine. She denies fever chills nausea vomiting muscle aches and pains. States that the toe is slightly stiff. She denies chest pain or shortness of breath.  Objective: Vital signs are stable she is alert and oriented 3. No calf pain swelling. She has minimal swelling to the forefoot left. She has mild tenderness on range of motion of the first metatarsophalangeal joint. Incision site is well coapted sutures were removed today. There is no bleeding no ecchymosis no malodor no signs of infection.  Assessment: Well-healing surgical biplanar Austin left first metatarsophalangeal joint. This surgery appears to be progressing normally.  Plan: Discussed etiology pathology conservative versus surgical therapies. At this point I encouraged range of motion exercises to increase the mobility. I will allow her to start washing this and massaging the foot. We place her in a compression anklet followed by a Darco shoe. She is not to walk without the shoe. We will follow-up with her in 2 weeks. She will call with questions or concerns as needed.  Roselind Messier DPM

## 2015-02-14 NOTE — Progress Notes (Signed)
Surgery at [facility]

## 2015-02-16 ENCOUNTER — Ambulatory Visit (INDEPENDENT_AMBULATORY_CARE_PROVIDER_SITE_OTHER): Payer: Medicare PPO | Admitting: Podiatry

## 2015-02-16 ENCOUNTER — Ambulatory Visit (INDEPENDENT_AMBULATORY_CARE_PROVIDER_SITE_OTHER): Payer: Medicare PPO

## 2015-02-16 ENCOUNTER — Encounter: Payer: Self-pay | Admitting: Podiatry

## 2015-02-16 VITALS — BP 127/74 | HR 67 | Resp 16

## 2015-02-16 DIAGNOSIS — M21612 Bunion of left foot: Secondary | ICD-10-CM

## 2015-02-16 DIAGNOSIS — Z9889 Other specified postprocedural states: Secondary | ICD-10-CM

## 2015-02-16 DIAGNOSIS — M2012 Hallux valgus (acquired), left foot: Secondary | ICD-10-CM | POA: Diagnosis not present

## 2015-02-16 DIAGNOSIS — M205X9 Other deformities of toe(s) (acquired), unspecified foot: Secondary | ICD-10-CM | POA: Diagnosis not present

## 2015-02-16 DIAGNOSIS — M79673 Pain in unspecified foot: Secondary | ICD-10-CM

## 2015-02-17 NOTE — Progress Notes (Signed)
Subjective:     Patient ID: Connie Burke, female   DOB: 02/08/1946, 69 y.o.   MRN: [id number]  HPI patient presents stating my left big toe is doing well but it feels at times like him walking on a rock   Review of Systems     Objective:   Physical Exam Neurovascular status intact muscle strength adequate with good range of motion first MPJ with mild plantar irritation    Assessment:     Doing well after surgery but does have some irritation around the first metatarsal as she has not walked on that side of her foot and a long time    Plan:     Reviewed x-rays indicating good healing and advised on continued range of motion and padded-type shoes and reappoint in about 6 weeks and may require orthotic therapy long-term

## 2015-03-10 ENCOUNTER — Ambulatory Visit (INDEPENDENT_AMBULATORY_CARE_PROVIDER_SITE_OTHER): Payer: Medicare PPO | Admitting: Podiatry

## 2015-03-10 ENCOUNTER — Ambulatory Visit (INDEPENDENT_AMBULATORY_CARE_PROVIDER_SITE_OTHER): Payer: Medicare PPO

## 2015-03-10 DIAGNOSIS — M205X9 Other deformities of toe(s) (acquired), unspecified foot: Secondary | ICD-10-CM

## 2015-03-10 DIAGNOSIS — M21612 Bunion of left foot: Secondary | ICD-10-CM | POA: Diagnosis not present

## 2015-03-10 DIAGNOSIS — Z9889 Other specified postprocedural states: Secondary | ICD-10-CM

## 2015-03-10 DIAGNOSIS — M79673 Pain in unspecified foot: Secondary | ICD-10-CM

## 2015-03-10 NOTE — Patient Instructions (Addendum)

## 2015-03-11 NOTE — Progress Notes (Signed)
Subjective:     Patient ID: Connie Burke, female   DOB: [birth year], 69 y.o.   MRN: [id number]  HPI patient presents stating I'm doing well with my left foot and it seems to be improving and having minimal discomfort   Review of Systems     Objective:   Physical Exam Neurovascular status intact muscle strength adequate with excellent healing of the osteotomy site with wound edges well coapted and good skin healing occurring with good range of motion    Assessment:     Doing well post osteotomy left    Plan:     Advised on physical therapy and anti-inflammatory's and patient will be seen back to recheck as I reviewed x-rays today. Patient can resume normal activities at this time

## 2015-04-20 ENCOUNTER — Ambulatory Visit (INDEPENDENT_AMBULATORY_CARE_PROVIDER_SITE_OTHER): Payer: Medicare PPO

## 2015-04-20 ENCOUNTER — Ambulatory Visit (INDEPENDENT_AMBULATORY_CARE_PROVIDER_SITE_OTHER): Payer: Medicare PPO | Admitting: Podiatry

## 2015-04-20 DIAGNOSIS — M21612 Bunion of left foot: Secondary | ICD-10-CM

## 2015-04-20 DIAGNOSIS — Z9889 Other specified postprocedural states: Secondary | ICD-10-CM

## 2015-04-20 DIAGNOSIS — M205X9 Other deformities of toe(s) (acquired), unspecified foot: Secondary | ICD-10-CM

## 2015-04-20 NOTE — Progress Notes (Signed)
Subjective:     Patient ID: Connie Burke, female   DOB: 03/19/1946, 69 y.o.   MRN: [id number]  HPI patient presents stating it's doing well with minimal discomfort and I'm able to walk distances   Review of Systems     Objective:   Physical Exam Neurovascular status intact muscle strength adequate range of motion within normal limits with patient noted to have no pain in the first MPJ left with wound edges that are well coapted    Assessment:     Doing well from having had  osteotomy first metatarsal left    Plan:     Reviewed x-rays patient be seen back as needed and is instructed on return to normal shoe gear

## 2015-04-21 ENCOUNTER — Ambulatory Visit: Payer: Medicare PPO | Admitting: Podiatry

## 2015-08-14 ENCOUNTER — Ambulatory Visit (INDEPENDENT_AMBULATORY_CARE_PROVIDER_SITE_OTHER): Payer: Commercial Managed Care - HMO | Admitting: Physician Assistant

## 2015-08-14 VITALS — BP 109/88 | HR 73 | Ht 64.0 in | Wt 136.0 lb

## 2015-08-14 DIAGNOSIS — Z0000 Encounter for general adult medical examination without abnormal findings: Secondary | ICD-10-CM | POA: Diagnosis not present

## 2015-08-14 DIAGNOSIS — Z1159 Encounter for screening for other viral diseases: Secondary | ICD-10-CM | POA: Diagnosis not present

## 2015-08-14 DIAGNOSIS — L989 Disorder of the skin and subcutaneous tissue, unspecified: Secondary | ICD-10-CM | POA: Insufficient documentation

## 2015-08-14 DIAGNOSIS — L821 Other seborrheic keratosis: Secondary | ICD-10-CM | POA: Diagnosis not present

## 2015-08-14 DIAGNOSIS — M810 Age-related osteoporosis without current pathological fracture: Secondary | ICD-10-CM

## 2015-08-14 DIAGNOSIS — Z13220 Encounter for screening for lipoid disorders: Secondary | ICD-10-CM

## 2015-08-14 DIAGNOSIS — Z131 Encounter for screening for diabetes mellitus: Secondary | ICD-10-CM

## 2015-08-14 NOTE — Progress Notes (Addendum)
Subjective:    Connie Burke is a 70 y.o. female who presents for Medicare Annual/Subsequent preventive examination.  Preventive Screening-Counseling & Management  Tobacco History  Smoking status  . Never Smoker   Smokeless tobacco  . Never Used     Problems Prior to Visit 1. She does have some dark and scaly skin spots she would like to have looked at. Hx of pre cancerous lesions that dermatology has removed before.   Current Problems (verified) Patient Active Problem List   Diagnosis Date Noted  . 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 Precancerous skin lesion 08/14/2015  . Osteoporosis 09/15/2014  . Bunion, left foot 09/02/2014  . Cystocele 08/30/2014    Medications Prior to Visit No current outpatient prescriptions on file prior to visit.   No current facility-administered medications on file prior to visit.    Current Medications (verified) No current outpatient prescriptions on file.   No current facility-administered medications for this visit.     Allergies (verified) Amoxicillin   PAST HISTORY  Family History Family History  Problem Relation Age of Onset  . Hypertension Mother   . Arthritis Mother   . Cancer Maternal Aunt     breast    Social History Social History  Substance Use Topics  . Smoking status: Never Smoker   . Smokeless tobacco: Never Used  . Alcohol Use: 0.0 oz/week    0 Standard drinks or equivalent per week     Comment: wine daily     Are there smokers in your home (other than you)? No  Risk Factors Current exercise habits: The patient does not participate in regular exercise at present.  Dietary issues discussed: none   Cardiac risk factors: advanced age (older than 91 for men, 86 for women).  Depression Screen (Note: if answer to either of the following is "Yes", a more complete depression screening is indicated)   Over the past two weeks, have you felt down, depressed or hopeless? No  Over the past two weeks, have you  felt little interest or pleasure in doing things? No  Have you lost interest or pleasure in daily life? No  Do you often feel hopeless? No  Do you cry easily over simple problems? No  Activities of Daily Living In your present state of health, do you have any difficulty performing the following activities?:  Driving? No Managing money?  No Feeding yourself? No Getting from bed to chair? No{ Climbing a flight of stairs? No Preparing food and eating?: No Bathing or showering? No Getting dressed: No Getting to the toilet? No Using the toilet:No Moving around from place to place: No In the past year have you fallen or had a near fall?:No   Are you sexually active?  Yes  Do you have more than one partner?  No  Hearing Difficulties: No Do you often ask people to speak up or repeat themselves? No Do you experience ringing or noises in your ears? No Do you have difficulty understanding soft or whispered voices? No   Do you feel that you have a problem with memory? Yes  Do you often misplace items? No  Do you feel safe at home?  Yes  Cognitive Testing  Alert? Yes  Normal Appearance?Yes  Oriented to person? Yes  Place? Yes   Time? Yes  Recall of three objects?  Yes  Can perform simple calculations? Yes  Displays appropriate judgment?Yes  Can read the correct time from a watch face?Yes   Advanced Directives have been  discussed with the patient? Yes  List the Names of Other Physician/Practitioners you currently use: 1.    Indicate any recent Medical Services you may have received from other than [facility] providers in the past year (date may be approximate).   There is no immunization history on file for this patient.  Screening Tests Health Maintenance  Topic Date Due  . Hepatitis C Screening  13-Nov-1945  . TETANUS/TDAP  03/16/1967  . PNA vac Low Risk Adult (1 of 2 - PCV13) 08/13/2016 (Originally 03/15/2013)  . INFLUENZA VACCINE  08/13/2017 (Originally 01/02/2015)  . ZOSTAVAX   08/14/2035 (Originally 03/15/2008)  . MAMMOGRAM  09/13/2016  . COLONOSCOPY  07/05/2023  . DEXA SCAN  Completed    All answers were reviewed with the patient and necessary referrals were made:  Iran Planas, PA-C   08/14/2015   History reviewed: allergies, current medications, past family history, past medical history, past social history, past surgical history and problem list  Review of Systems A comprehensive review of systems was negative.    Objective:     Vision by Snellen chart: right eye:20/20, left eye:20/20  Body mass index is 23.33 kg/(m^2). BP 109/88 mmHg  Pulse 73  Ht 5\' 4"  (1.626 m)  Wt 136 lb (61.689 kg)  BMI 23.33 kg/m2  BP 109/88 mmHg  Pulse 73  Ht 5\' 4"  (1.626 m)  Wt 136 lb (61.689 kg)  BMI 23.33 kg/m2  General Appearance:    Alert, cooperative, no distress, appears stated age  Head:    Normocephalic, without obvious abnormality, atraumatic  Eyes:    PERRL, conjunctiva/corneas clear, EOM's intact, fundi    benign, both eyes  Ears:    Normal TM's and external ear canals, both ears  Nose:   Nares normal, septum midline, mucosa normal, no drainage    or sinus tenderness  Throat:   Lips, mucosa, and tongue normal; teeth and gums normal  Neck:   Supple, symmetrical, trachea midline, no adenopathy;    thyroid:  no enlargement/tenderness/nodules; no carotid   bruit or JVD  Back:     Symmetric, no curvature, ROM normal, no CVA tenderness  Lungs:     Clear to auscultation bilaterally, respirations unlabored  Chest Wall:    No tenderness or deformity   Heart:    Regular rate and rhythm, S1 and S2 normal, no murmur, rub   or gallop  Breast Exam:    No tenderness, masses, or nipple abnormality  Abdomen:     Soft, non-tender, bowel sounds active all four quadrants,    no masses, no organomegaly  Genitalia:  Not done   Rectal:  Not done  Extremities:   Extremities normal, atraumatic, no cyanosis or edema  Pulses:   2+ and symmetric all extremities  Skin:    Skin color, texture, turgor normal, no rashes or lesions multiple brown to gray waxy rasied papules with wart appearance over chest, trunk, legs and arms.   Lymph nodes:   Cervical, supraclavicular, and axillary nodes normal  Neurologic:   CNII-XII intact, normal strength, sensation and reflexes    throughout       Assessment:     Normal female exam.     Plan:     During the course of the visit the patient was educated and counseled about appropriate screening and preventive services including:    Pt declined zostavax, tetanus, pneumonia  Will get hep C antibody today.   Will make referral to dermatology for SK's due to hx of  pre-cancerous skin lesions. HO given concerning SK's. Offered to remove today. Pt declined and wanted referral.   Fasting labs ordered today.   No need for pap. Mammogram up to date.   Colonoscopy up to date.   Osteoporosis- bone density last year. Discussed with patient that she needs to be on medication. Discussed options. Printed out information on prolia. She at the least needs to be on vitamin D 1000 units and calcium 1500mg . Pt is aware.   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 2 points missing. Not concerning. Discussed normal findings. Encouraged crossword puzzles and to keep brain working. Follow up as needed. She discussed a genetic test to screen for alzhemiers. I will look into this for patient. Both parents had memory issues later in life but unaware of any specific dx.   Diet review for nutrition referral? Not Indicated   Patient Instructions (the written plan) was given to the patient.  Medicare Attestation I have personally reviewed: The patient's medical and social history Their use of alcohol, tobacco or illicit drugs Their current medications and supplements The patient's functional ability including ADLs,fall risks, home safety risks, cognitive, and hearing and visual impairment Diet and physical activities Evidence for depression or mood  disorders  The patient's weight, height, BMI, and visual acuity have been recorded in the chart.  I have made referrals, counseling, and provided education to the patient based on review of the above and I have provided the patient with a written personalized care plan for preventive services.     Iran Planas, PA-C   08/14/2015

## 2015-08-14 NOTE — Patient Instructions (Addendum)
Keeping You Healthy  Get These Tests  Blood Pressure- Have your blood pressure checked by your healthcare provider at least once a year.  Normal blood pressure is 120/80.  Weight- Have your body mass index (BMI) calculated to screen for obesity.  BMI is a measure of body fat based on height and weight.  You can calculate your own BMI at www.nhlbisupport.com/bmi/  Cholesterol- Have your cholesterol checked every year.  Diabetes- Have your blood sugar checked every year if you have high blood pressure, high cholesterol, a family history of diabetes or if you are overweight.  Pap Test - Have a pap test every 1 to 5 years if you have been sexually active.  If you are older than 65 and recent pap tests have been normal you may not need additional pap tests.  In addition, if you have had a hysterectomy  for benign disease additional pap tests are not necessary.  Mammogram-Yearly mammograms are essential for early detection of breast cancer  Screening for Colon Cancer- Colonoscopy starting at age 50. Screening may begin sooner depending on your family history and other health conditions.  Follow up colonoscopy as directed by your Gastroenterologist.  Screening for Osteoporosis- Screening begins at age 65 with bone density scanning, sooner if you are at higher risk for developing Osteoporosis.  Get these medicines  Calcium with Vitamin D- Your body requires 1200-1500 mg of Calcium a day and 800-1000 IU of Vitamin D a day.  You can only absorb 500 mg of Calcium at a time therefore Calcium must be taken in 2 or 3 separate doses throughout the day.  Hormones- Hormone therapy has been associated with increased risk for certain cancers and heart disease.  Talk to your healthcare provider about if you need relief from menopausal symptoms.  Aspirin- Ask your healthcare provider about taking Aspirin to prevent Heart Disease and Stroke.  Get these Immuniztions  Flu shot- Every fall  Pneumonia shot-  Once after the age of 65; if you are younger ask your healthcare provider if you need a pneumonia shot.  Tetanus- Every ten years.  Zostavax- Once after the age of 60 to prevent shingles.  Take these steps  Don't smoke- Your healthcare provider can help you quit. For tips on how to quit, ask your healthcare provider or go to www.smokefree.gov or call 1-800 QUIT-NOW.  Be physically active- Exercise 5 days a week for a minimum of 30 minutes.  If you are not already physically active, start slow and gradually work up to 30 minutes of moderate physical activity.  Try walking, dancing, bike riding, swimming, etc.  Eat a healthy diet- Eat a variety of healthy foods such as fruits, vegetables, whole grains, low fat milk, low fat cheeses, yogurt, lean meats, chicken, fish, eggs, dried beans, tofu, etc.  For more information go to www.thenutritionsource.org  Dental visit- Brush and floss teeth twice daily; visit your dentist twice a year.  Eye exam- Visit your Optometrist or Ophthalmologist yearly.  Drink alcohol in moderation- Limit alcohol intake to one drink or less a day.  Never drink and drive.  Depression- Your emotional health is as important as your physical health.  If you're feeling down or losing interest in things you normally enjoy, please talk to your healthcare provider.  Seat Belts- can save your life; always wear one  Smoke/Carbon Monoxide detectors- These detectors need to be installed on the appropriate level of your home.  Replace batteries at least once a year.  Violence- If   anyone is threatening or hurting you, please tell your healthcare provider. Living Will/ Health care power of attorney- Discuss with your healthcare provider and family.  Seborrheic Keratosis Seborrheic keratosis is a common, noncancerous (benign) skin growth. This condition causes waxy, rough, tan, brown, or black spots to appear on the skin. These skin growths can be flat or raised. CAUSES The cause  of this condition is not known. RISK FACTORS This condition is more likely to develop in:  People who have a family history of seborrheic keratosis.  People who are 65 or older.  People who are pregnant.  People who have had estrogen replacement therapy. SYMPTOMS This condition often occurs on the face, chest, shoulders, back, or other areas. These growths:  Are usually painless, but may become irritated and itchy.  Can be yellow, brown, black, or other colors.  Are slightly raised or have a flat surface.  Are sometimes rough or wart-like in texture.  Are often waxy on the surface.  Are round or oval-shaped.  Sometimes look like they are "stuck on."  Often occur in groups, but may occur as a single growth. DIAGNOSIS This condition is diagnosed with a medical history and physical exam. A sample of the growth may be tested (skin biopsy). You may need to see a skin specialist (dermatologist). TREATMENT Treatment is not usually needed for this condition, unless the growths are irritated or are often bleeding. You may also choose to have the growths removed if you do not like their appearance. Most commonly, these growths are treated with a procedure in which liquid nitrogen is applied to "freeze" off the growth (cryosurgery). They may also be burned off with electricity or cut off. HOME CARE INSTRUCTIONS  Watch your growth for any changes.  Keep all follow-up visits as told by your health care provider. This is important.  Do not scratch or pick at the growth or growths. This can cause them to become irritated or infected. SEEK MEDICAL CARE IF:  You suddenly have many new growths.  Your growth bleeds, itches, or hurts.  Your growth suddenly becomes larger or changes color.   This information is not intended to replace advice given to you by your health care provider. Make sure you discuss any questions you have with your health care provider.   Document Released:  06/22/2010 Document Revised: 02/08/2015 Document Reviewed: 10/05/2014 Elsevier Interactive Patient Education 2016 [employer]. Denosumab injection What is this medicine? DENOSUMAB (den oh sue mab) slows bone breakdown. Prolia is used to treat osteoporosis in women after menopause and in men. Delton See is used to prevent bone fractures and other bone problems caused by cancer bone metastases. Delton See is also used to treat giant cell tumor of the bone. This medicine may be used for other purposes; ask your health care provider or pharmacist if you have questions. What should I tell my health care provider before I take this medicine? They need to know if you have any of these conditions: -dental disease -eczema -infection or history of infections -kidney disease or on dialysis -low blood calcium or vitamin D -malabsorption syndrome -scheduled to have surgery or tooth extraction -taking medicine that contains denosumab -thyroid or parathyroid disease -an unusual reaction to denosumab, other medicines, foods, dyes, or preservatives -pregnant or trying to get pregnant -breast-feeding How should I use this medicine? This medicine is for injection under the skin. It is given by a health care professional in a hospital or clinic setting. If you are getting Prolia, a  special MedGuide will be given to you by the pharmacist with each prescription and refill. Be sure to read this information carefully each time. For Prolia, talk to your pediatrician regarding the use of this medicine in children. Special care may be needed. For Delton See, talk to your pediatrician regarding the use of this medicine in children. While this drug may be prescribed for children as young as 13 years for selected conditions, precautions do apply. Overdosage: If you think you have taken too much of this medicine contact a poison control center or emergency room at once. NOTE: This medicine is only for you. Do not share this medicine  with others. What if I miss a dose? It is important not to miss your dose. Call your doctor or health care professional if you are unable to keep an appointment. What may interact with this medicine? Do not take this medicine with any of the following medications: -other medicines containing denosumab This medicine may also interact with the following medications: -medicines that suppress the immune system -medicines that treat cancer -steroid medicines like prednisone or cortisone This list may not describe all possible interactions. Give your health care provider a list of all the medicines, herbs, non-prescription drugs, or dietary supplements you use. Also tell them if you smoke, drink alcohol, or use illegal drugs. Some items may interact with your medicine. What should I watch for while using this medicine? Visit your doctor or health care professional for regular checks on your progress. Your doctor or health care professional may order blood tests and other tests to see how you are doing. Call your doctor or health care professional if you get a cold or other infection while receiving this medicine. Do not treat yourself. This medicine may decrease your body's ability to fight infection. You should make sure you get enough calcium and vitamin D while you are taking this medicine, unless your doctor tells you not to. Discuss the foods you eat and the vitamins you take with your health care professional. See your dentist regularly. Brush and floss your teeth as directed. Before you have any dental work done, tell your dentist you are receiving this medicine. Do not become pregnant while taking this medicine or for 5 months after stopping it. Women should inform their doctor if they wish to become pregnant or think they might be pregnant. There is a potential for serious side effects to an unborn child. Talk to your health care professional or pharmacist for more information. What side effects  may I notice from receiving this medicine? Side effects that you should report to your doctor or health care professional as soon as possible: -allergic reactions like skin rash, itching or hives, swelling of the face, lips, or tongue -breathing problems -chest pain -fast, irregular heartbeat -feeling faint or lightheaded, falls -fever, chills, or any other sign of infection -muscle spasms, tightening, or twitches -numbness or tingling -skin blisters or bumps, or is dry, peels, or red -slow healing or unexplained pain in the mouth or jaw -unusual bleeding or bruising Side effects that usually do not require medical attention (Report these to your doctor or health care professional if they continue or are bothersome.): -muscle pain -stomach upset, gas This list may not describe all possible side effects. Call your doctor for medical advice about side effects. You may report side effects to FDA at 1-800-FDA-1088. Where should I keep my medicine? This medicine is only given in a clinic, doctor's office, or other health care setting  and will not be stored at home. NOTE: This sheet is a summary. It may not cover all possible information. If you have questions about this medicine, talk to your doctor, pharmacist, or health care provider.    2016, Elsevier/Gold Standard. (2011-11-18 12:37:47)

## 2015-08-15 LAB — COMPLETE METABOLIC PANEL WITH GFR
ALT: 11 U/L (ref 6–29)
AST: 11 U/L (ref 10–35)
Albumin: 4.3 g/dL (ref 3.6–5.1)
Alkaline Phosphatase: 72 U/L (ref 33–130)
BILIRUBIN TOTAL: 0.5 mg/dL (ref 0.2–1.2)
BUN: 17 mg/dL (ref 7–25)
CHLORIDE: 104 mmol/L (ref 98–110)
CO2: 27 mmol/L (ref 20–31)
Calcium: 9.5 mg/dL (ref 8.6–10.4)
Creat: 0.6 mg/dL (ref 0.50–0.99)
GLUCOSE: 85 mg/dL (ref 65–99)
POTASSIUM: 4 mmol/L (ref 3.5–5.3)
Sodium: 140 mmol/L (ref 135–146)
Total Protein: 6.8 g/dL (ref 6.1–8.1)

## 2015-08-15 LAB — LIPID PANEL
CHOL/HDL RATIO: 2.7 ratio (ref <=5.0)
CHOLESTEROL: 221 mg/dL — AB (ref 125–200)
HDL: 83 mg/dL (ref >=46)
LDL Cholesterol: 122 mg/dL (ref <130)
Triglycerides: 82 mg/dL (ref <150)
VLDL: 16 mg/dL (ref <30)

## 2015-08-15 LAB — HEPATITIS C ANTIBODY: HCV Ab: NEGATIVE

## 2015-08-16 ENCOUNTER — Telehealth: Payer: Self-pay | Admitting: Physician Assistant

## 2015-08-16 ENCOUNTER — Encounter: Payer: Self-pay | Admitting: Physician Assistant

## 2015-08-16 NOTE — Telephone Encounter (Signed)
Call pt: she was wanting information on a genetic test to tell if she would have alzheimer or not.  This is what I found.    there is a blood test can identify which APOE alleles a person has, but results cannot predict who will or will not develop Alzheimer's disease. It is unlikely that genetic testing will ever be able to predict the disease with 100 percent accuracy, researchers believe, because too many other factors may influence its development and progression.  I am more than happy to refer to genetics counselor if you would like consult. However, you do likely need to know some specifics about parents diagnoses that would help gauge risk.

## 2015-09-04 ENCOUNTER — Encounter: Payer: Self-pay | Admitting: Podiatry

## 2015-09-04 ENCOUNTER — Ambulatory Visit (INDEPENDENT_AMBULATORY_CARE_PROVIDER_SITE_OTHER): Payer: Medicare PPO | Admitting: Podiatry

## 2015-09-04 ENCOUNTER — Ambulatory Visit (INDEPENDENT_AMBULATORY_CARE_PROVIDER_SITE_OTHER): Payer: Commercial Managed Care - HMO

## 2015-09-04 VITALS — BP 128/73 | HR 68 | Resp 16

## 2015-09-04 DIAGNOSIS — M7662 Achilles tendinitis, left leg: Secondary | ICD-10-CM

## 2015-09-04 DIAGNOSIS — M79671 Pain in right foot: Secondary | ICD-10-CM | POA: Diagnosis not present

## 2015-09-04 DIAGNOSIS — Z8739 Personal history of other diseases of the musculoskeletal system and connective tissue: Secondary | ICD-10-CM

## 2015-09-04 DIAGNOSIS — Z87828 Personal history of other (healed) physical injury and trauma: Secondary | ICD-10-CM

## 2015-09-04 DIAGNOSIS — M79672 Pain in left foot: Secondary | ICD-10-CM

## 2015-09-04 DIAGNOSIS — M7661 Achilles tendinitis, right leg: Secondary | ICD-10-CM

## 2015-09-04 MED ORDER — TRIAMCINOLONE ACETONIDE 10 MG/ML IJ SUSP: 10.0000 mg | Freq: Once | INTRAMUSCULAR | Status: DC

## 2015-09-04 MED ORDER — TRIAMCINOLONE ACETONIDE 10 MG/ML IJ SUSP
10.0000 mg | Freq: Once | INTRAMUSCULAR | Status: AC
  Administered 2015-09-04: 10 mg

## 2015-09-04 NOTE — Patient Instructions (Addendum)

## 2015-09-05 NOTE — Progress Notes (Signed)
Subjective:     Patient ID: Connie Burke, female   DOB: 1946/04/26, 70 y.o.   MRN: [id number]  HPI patient states I been doing pretty good underneath but the right Achilles on the outside has been bothersome and making it hard at times to walk comfortably   Review of Systems     Objective:   Physical Exam Neurovascular status intact with plantar pain doing well with discomfort in the posterior aspect of the right heel lateral side with quite a bit of pain when I palpated this area    Assessment:     Achilles tendinitis right lateral side over left    Plan:     Careful sheath injection administered after explaining chances for rupture 3 mg dexamethasone Kenalog 5 mg Xylocaine and L5 Stone wearing her air fracture walker that she had. Advised on ice therapy gradual stretching and he'll lift usage and reappoint to recheck in 3 weeks

## 2015-09-13 NOTE — Telephone Encounter (Signed)
Pt notified of information.  She is going to do some thinking and more researching on the subject.

## 2015-09-18 ENCOUNTER — Ambulatory Visit: Payer: Commercial Managed Care - HMO | Admitting: Podiatry

## 2015-11-23 ENCOUNTER — Encounter: Payer: Self-pay | Admitting: Podiatry

## 2015-11-23 ENCOUNTER — Ambulatory Visit (INDEPENDENT_AMBULATORY_CARE_PROVIDER_SITE_OTHER): Payer: Medicare PPO | Admitting: Podiatry

## 2015-11-23 VITALS — BP 124/74 | HR 88 | Resp 12

## 2015-11-23 DIAGNOSIS — M7662 Achilles tendinitis, left leg: Secondary | ICD-10-CM

## 2015-11-23 DIAGNOSIS — M722 Plantar fascial fibromatosis: Secondary | ICD-10-CM

## 2015-11-23 DIAGNOSIS — M7661 Achilles tendinitis, right leg: Secondary | ICD-10-CM

## 2015-11-24 NOTE — Progress Notes (Signed)
Subjective:     Patient ID: Connie Burke, female   DOB: 02-14-1946, 70 y.o.   MRN: [id number]  HPI patient presents stating I'm still having a lot of pain in the back of my heel and medication injection and immobilization has not successfully taken care of   Review of Systems     Objective:   Physical Exam Neurovascular status intact muscle strength adequate with inflammation pain on the lateral side of the right Achilles at the insertion of the Achilles into the posterior heel. 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 Scheduled for shockwave therapy at this time

## 2015-11-27 ENCOUNTER — Other Ambulatory Visit: Payer: Medicare PPO

## 2016-02-13 ENCOUNTER — Telehealth: Payer: Self-pay | Admitting: Physician Assistant

## 2016-02-13 ENCOUNTER — Other Ambulatory Visit: Payer: Self-pay | Admitting: Physician Assistant

## 2016-02-13 DIAGNOSIS — Z1231 Encounter for screening mammogram for malignant neoplasm of breast: Secondary | ICD-10-CM

## 2016-02-13 NOTE — Telephone Encounter (Signed)
Patient called request to get a mammogram order. Thanks

## 2016-02-13 NOTE — Telephone Encounter (Signed)
Amber can you place mammogram order.

## 2016-02-13 NOTE — Telephone Encounter (Signed)
Advised pt that she doesn't need [place] to order a mammo every year, imaging can do that while they schedule it.  Transferred pt to imaging.

## 2016-02-16 ENCOUNTER — Ambulatory Visit: Payer: Commercial Managed Care - HMO

## 2016-02-28 ENCOUNTER — Ambulatory Visit (INDEPENDENT_AMBULATORY_CARE_PROVIDER_SITE_OTHER): Payer: Medicare PPO

## 2016-02-28 DIAGNOSIS — Z1231 Encounter for screening mammogram for malignant neoplasm of breast: Secondary | ICD-10-CM

## 2016-07-08 ENCOUNTER — Ambulatory Visit (INDEPENDENT_AMBULATORY_CARE_PROVIDER_SITE_OTHER): Payer: Medicare HMO | Admitting: Family Medicine

## 2016-07-08 VITALS — BP 115/55

## 2016-07-08 DIAGNOSIS — M7661 Achilles tendinitis, right leg: Secondary | ICD-10-CM | POA: Insufficient documentation

## 2016-07-08 DIAGNOSIS — M79672 Pain in left foot: Secondary | ICD-10-CM | POA: Diagnosis not present

## 2016-07-08 MED ORDER — NITROGLYCERIN 0.2 MG/HR TD PT24: MEDICATED_PATCH | TRANSDERMAL | 1 refills | Status: DC

## 2016-07-08 NOTE — Patient Instructions (Addendum)
Thank you for coming in today.  Nitroglycerin Protocol   Apply 1/4 nitroglycerin patch to affected area daily.  Change position of patch within the affected area every 24 hours.  You may experience a headache during the first 1-2 weeks of using the patch, these should subside.  If you experience headaches after beginning nitroglycerin patch treatment, you may take your preferred over the counter pain reliever.  Another side effect of the nitroglycerin patch is skin irritation or rash related to patch adhesive.  Please notify our office if you develop more severe headaches or rash, and stop the patch.  Tendon healing with nitroglycerin patch may require 12 to 24 weeks depending on the extent of injury.  Men should not use if taking Viagra, Cialis, or Levitra.   Do not use if you have migraines or rosacea.   Turf toe plate or mortons toe plate   Recheck in 4 weeks

## 2016-07-08 NOTE — Progress Notes (Signed)
Connie Burke is a 71 y.o. female who presents to [facility] today for bilateral foot and ankle pain.  Patient has had foot and ankle pain for some time now. She's been seen by Dr. Paulla Dolly who in August of 2016 perfused the left first MTP to treat hallux rigidus.  Since then she's developed contralateral right Achilles tendinopathy and had deformity.  She's been seen by Dr. Paulla Dolly several times and had a right achilles tendon sheath injection which did not help much.   At this point Connie Burke is somewhat frustrated with her continued foot and ankle pain. She has pain at the posterior right calcaneus into the forefoot of her left foot. She notes she has a lot of trouble finding a pair of shoes that relieve her right heel pain but don't exacerbate her left forefoot pain. She notes the pain has limited her activities. She no longer is walking much because it hurts. She denies any new injury fevers or chills.   No past medical history on file. Past Surgical History:  Procedure Laterality Date  . TUBAL LIGATION     Social History  Substance Use Topics  . Smoking status: Never Smoker  . Smokeless tobacco: Never Used  . Alcohol use 0.0 oz/week     Comment: wine daily     ROS:  As above   Medications: Current Outpatient Prescriptions  Medication Sig Dispense Refill  . nitroGLYCERIN (NITRODUR - DOSED IN MG/24 HR) 0.2 mg/hr patch 1/4 patch to heel daily for tendonitis 30 patch 1   Current Facility-Administered Medications  Medication Dose Route Frequency Provider Last Rate Last Dose  . triamcinolone acetonide (KENALOG) 10 MG/ML injection 10 mg  10 mg Other Once Wallene Huh, DPM       Allergies  Allergen Reactions  . Amoxicillin     Does not prefer to take due to childs reaction to medication with rash and abdominal pain.      Exam:  BP (!) 115/55  General: Well Developed, well nourished, and in no acute distress.  Neuro/Psych: Alert  and oriented x3, extra-ocular muscles intact, able to move all 4 extremities, sensation grossly intact. Skin: Warm and dry, no rashes noted.  Respiratory: Not using accessory muscles, speaking in full sentences, trachea midline.  Cardiovascular: Pulses palpable, no extremity edema. Abdomen: Does not appear distended. MSK:  Feet bilaterally have a cavus appearing arch. She has an enlarged posterior calcaneus with visible Haglund's deformity at the right calcaneus. The left foot has a well-appearing or mature surgical incision scar at the dorsal aspect of the first MTP. She has very little first MTP motion. The feet are nontender over the exception of the right posterior calcaneus. Pulses capillary refill sensation are intact distally.   X-ray right foot dated July 2016 shows a Haglund's deformity and heel spur.   No results found for this or any previous visit (from the past 48 hour(s)). No results found.    Assessment and Plan: 71 y.o. 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 Plan to treat with eccentric exercises and nitroglycerin patch protocol.  Left forefoot pain: Patient has had a fusion at the left first MTP to treat hallux rigidus. I recommend she use a Morton's or trophic toe carb or thyromegaly in her shoe to help with toe off as this will improve her gait. I also recommend doing some home foot intrinsic muscle exercises.  Recheck in about 4 weeks or so.  No orders of the defined types were placed in this encounter.   Discussed warning signs or symptoms. Please see discharge instructions. Patient expresses understanding.

## 2016-07-09 ENCOUNTER — Telehealth: Payer: Self-pay | Admitting: Family Medicine

## 2016-07-09 NOTE — Telephone Encounter (Signed)
Patient came by needing clarification on your instructions from 07/08/16. She stated that she was looking online for the shoe insert you recommended. She stated that there are so many options (stiff, flexible, w/ steel rod, w/out steel rod) at various prices that she is unsure of what could help her most and still be affordable. Please advise. Thanks!

## 2016-07-09 NOTE — Telephone Encounter (Signed)
She should get a carbon fiber flexible semirigid full-length Morton's or turf  toe insert

## 2016-07-10 NOTE — Telephone Encounter (Signed)
Called pt. She was driving and will call back later.

## 2016-07-11 NOTE — Telephone Encounter (Signed)
Information discussed with pt. Pt verbalized understanding. 

## 2016-07-22 DIAGNOSIS — H524 Presbyopia: Secondary | ICD-10-CM | POA: Diagnosis not present

## 2016-07-22 DIAGNOSIS — H2513 Age-related nuclear cataract, bilateral: Secondary | ICD-10-CM | POA: Diagnosis not present

## 2016-08-05 ENCOUNTER — Ambulatory Visit: Payer: Medicare HMO | Admitting: Family Medicine

## 2016-10-17 ENCOUNTER — Encounter: Payer: Self-pay | Admitting: *Deleted

## 2016-10-17 ENCOUNTER — Emergency Department (INDEPENDENT_AMBULATORY_CARE_PROVIDER_SITE_OTHER)
Admission: EM | Admit: 2016-10-17 | Discharge: 2016-10-17 | Disposition: A | Discharge status: Home / Self Care | Payer: Medicare HMO | Source: Home / Self Care | Attending: Family Medicine | Admitting: Family Medicine

## 2016-10-17 DIAGNOSIS — G510 Bell's palsy: Secondary | ICD-10-CM

## 2016-10-17 HISTORY — DX: Osteopetrosis: Q78.2

## 2016-10-17 MED ORDER — VALACYCLOVIR HCL 1 G PO TABS: 1000.0000 mg | ORAL_TABLET | Freq: Three times a day (TID) | ORAL | 0 refills | Status: DC

## 2016-10-17 NOTE — Discharge Instructions (Signed)
Use lubricating eye drops in the left eye several times daily, and lubricating eye ointment at night.  Wear a left eye patch during the night. If symptoms become significantly worse during the night or over the weekend, proceed to the local emergency room.

## 2016-10-17 NOTE — ED Provider Notes (Signed)
[facility]    CSN: [id number] Arrival date & time: 10/17/16  1611     History   Chief Complaint Chief Complaint  Patient presents with  . Facial Droop    left  . Eye Problem    left eye burning    HPI Connie Burke is a 71 y.o. female.   Patient reports that her eyes felt "tired" about 2 to 3 days ago, and she noticed a metallic taste in her mouth 2 days ago.  At 10am today she noticed that her left eye felt "dry," and did not improve with left eye irrigation.  At 3:15 pm today she visited her daughter who observed that her left eye was not closing completely and her left face was drooping.  No headache, weakness, ataxia, speech difficulty, etc.     The history is provided by the patient.    Past Medical History:  Diagnosis Date  . Osteopetrosis     Patient Active Problem List   Diagnosis Date Noted  . Tendonitis, Achilles, right 07/08/2016  . Left foot pain 07/08/2016  . 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 Precancerous skin lesion 08/14/2015  . Osteoporosis 09/15/2014  . Bunion, left foot 09/02/2014  . Cystocele 08/30/2014    Past Surgical History:  Procedure Laterality Date  . TUBAL LIGATION      OB History    Gravida Para Term Preterm AB Living   4 4 4     4    SAB TAB Ectopic Multiple Live Births                   Home Medications    Prior to Admission medications   Medication Sig Start Date End Date Taking? Authorizing Provider  nitroGLYCERIN (NITRODUR - DOSED IN MG/24 HR) 0.2 mg/hr patch 1/4 patch to heel daily for tendonitis 07/08/16   Gregor Hams, MD  valACYclovir (VALTREX) 1000 MG tablet Take 1 tablet (1,000 mg total) by mouth 3 (three) times daily. 10/17/16   Kandra Nicolas, MD    Family History Family History  Problem Relation Age of Onset  . Hypertension Mother   . Arthritis Mother   . Cancer Maternal Aunt        breast    Social History Social History  Substance Use Topics  . Smoking status: Never Smoker  .  Smokeless tobacco: Never Used  . Alcohol use 0.0 oz/week     Comment: wine daily     Allergies   Amoxicillin   Review of Systems Review of Systems  Constitutional: Negative for activity change, appetite change, chills, diaphoresis, fatigue and fever.  HENT: Negative for congestion, drooling, ear pain, facial swelling, hearing loss, mouth sores, rhinorrhea, sore throat, tinnitus, trouble swallowing and voice change.   Eyes: Positive for pain. Negative for photophobia, discharge, redness, itching and visual disturbance.  Respiratory: Negative.   Cardiovascular: Negative.   Gastrointestinal: Negative.   Genitourinary: Negative.   Musculoskeletal: Negative.   Skin: Negative.   Neurological: Positive for facial asymmetry. Negative for dizziness, tremors, seizures, syncope, speech difficulty, weakness, light-headedness, numbness and headaches.     Physical Exam Triage Vital Signs ED Triage Vitals  Enc Vitals Group     BP 10/17/16 1637 125/78     Pulse Rate 10/17/16 1637 74     Resp 10/17/16 1637 14     Temp --      Temp src --      SpO2 10/17/16 1637 99 %  Weight 10/17/16 1638 131 lb (59.4 kg)     Height 10/17/16 1638 5\' 4"  (1.626 m)     Head Circumference --      Peak Flow --      Pain Score 10/17/16 1638 2     Pain Loc --      Pain Edu? --      Excl. in [place]? --    No data found.   Updated Vital Signs BP 125/78 (BP Location: Left Arm)   Pulse 74   Resp 14   Ht 5\' 4"  (1.626 m)   Wt 131 lb (59.4 kg)   SpO2 99%   BMI 22.49 kg/m   Visual Acuity Right Eye Distance:   Left Eye Distance:   Bilateral Distance:    Right Eye Near:   Left Eye Near:    Bilateral Near:     Physical Exam Nursing notes and Vital Signs reviewed. Appearance:  Patient appears stated age, and in no acute distress.  Note mild unilateral left facial paralysis:  Difficulty completely closing the left eye, and drooping of the left corner of mouth. Eyes:  Pupils are equal, round, and  reactive to light and accomodation.  Extraocular movement is intact.  Conjunctivae are not inflamed  Ears:  Canals normal.  Tympanic membranes normal.  Nose:  Normal turbinates.  No sinus tenderness.  Mouth:  Normal Pharynx:  Normal Neck:  Supple.  No adenopathy. Lungs:  Clear to auscultation.  Breath sounds are equal.  Moving air well. Heart:  Regular rate and rhythm without murmurs, rubs, or gallops.  Abdomen:  Nontender without masses or hepatosplenomegaly.  Bowel sounds are present.  No CVA or flank tenderness.  Extremities:  No edema.  Skin:  No rash present.   Neurologic:  Cranial nerves 2 through 12 are normal except for 7th (facial nerve).  Patellar, achilles, and elbow reflexes are normal.  Cerebellar function is intact (finger-to-nose and rapid alternating hand movement).  Gait and station are normal.  Grip strength symmetric bilaterally.   UC Treatments / Results  Labs (all labs ordered are listed, but only abnormal results are displayed) Labs Reviewed - No data to display  EKG  EKG Interpretation None       Radiology No results found.  Procedures Procedures (including critical care time)  Medications Ordered in UC Medications - No data to display   Initial Impression / Assessment and Plan / UC Course  I have reviewed the triage vital signs and the nursing notes.  Pertinent labs & imaging results that were available during my care of the patient were reviewed by me and considered in my medical decision making (see chart for details).    Patient declined Rx for prednisone, but agrees to begin Valtrex. Use lubricating eye drops in the left eye several times daily, and lubricating eye ointment at night.  Wear a left eye patch during the night. If symptoms become significantly worse during the night or over the weekend, proceed to the local emergency room.  Gave patient extensive information (UptoDate "Basics" and "Beyond the Basics") in attempt to encourage her to  begin prednisone. Recommend that she follow-up with her PCP as soon as possible.     Final Clinical Impressions(s) / UC Diagnoses   Final diagnoses:  Bell's palsy    New Prescriptions New Prescriptions   VALACYCLOVIR (VALTREX) 1000 MG TABLET    Take 1 tablet (1,000 mg total) by mouth 3 (three) times daily.     Theone Murdoch  A, MD 10/17/16 2004

## 2016-10-17 NOTE — ED Triage Notes (Signed)
Patient reports she developed burinng in her left eye this Am about 10 am. She irrigated many times without relief. About 3:15 she went to her daughters who noted her left eye wasn't closing all the way her her face was drooping. No h/o cardiovascular disease. Her mother had TIA in her 53s. A&O x 4, pupils E&R, slight tongue deviation.

## 2016-10-21 ENCOUNTER — Encounter: Payer: Self-pay | Admitting: Physician Assistant

## 2016-10-21 ENCOUNTER — Ambulatory Visit (INDEPENDENT_AMBULATORY_CARE_PROVIDER_SITE_OTHER): Payer: Medicare HMO | Admitting: Physician Assistant

## 2016-10-21 VITALS — BP 126/78 | HR 72 | Ht 64.0 in | Wt 130.0 lb

## 2016-10-21 DIAGNOSIS — G510 Bell's palsy: Secondary | ICD-10-CM

## 2016-10-21 MED ORDER — PREDNISONE 10 MG PO TABS: ORAL_TABLET | ORAL | 0 refills | Status: DC

## 2016-10-21 NOTE — Patient Instructions (Signed)
Bell Palsy Bell palsy is a condition in which the muscles on one side of the face become paralyzed. This often causes one side of the face to droop. It is a common condition and most people recover completely. RISK FACTORS Risk factors for Bell palsy include:  Pregnancy.  Diabetes.  An infection by a virus, such as infections that cause cold sores. CAUSES  Bell palsy is caused by damage to or inflammation of a nerve in your face. It is unclear why this happens, but an infection by a virus may lead to it. Most of the time the reason it happens is unknown. SIGNS AND SYMPTOMS  Symptoms can range from mild to severe and can take place over a number of hours. Symptoms may include:  Being unable to:  Raise one or both eyebrows.  Close one or both eyes.  Feel parts of your face (facial numbness).  Drooping of the eyelid and corner of the mouth.  Weakness in the face.  Paralysis of half your face.  Loss of taste.  Sensitivity to loud noises.  Difficulty chewing.  Tearing up of the affected eye.  Dryness in the affected eye.  Drooling.  Pain behind one ear. DIAGNOSIS  Diagnosis of Bell palsy may include:  A medical history and physical exam.  An MRI.  A CT scan.  Electromyography (EMG). This is a test that checks how your nerves are working. TREATMENT  Treatment may include antiviral medicine to help shorten the length of the condition. Sometimes treatment is not needed and the symptoms go away on their own. HOME CARE INSTRUCTIONS   Take medicines only as directed by your health care provider.  Do facial massages and exercises as directed by your health care provider.  If your eye is affected:  Use moisturizing eye drops to prevent drying of your eye as directed by your health care provider.  Protect your eye as directed by your health care provider. SEEK MEDICAL CARE IF:  Your symptoms do not get better or get worse.  You are drooling.  Your eye is red,  irritated, or hurts. SEEK IMMEDIATE MEDICAL CARE IF:   Another part of your body feels weak or numb.  You have difficulty swallowing.  You have a fever along with symptoms of Bell palsy.  You develop neck pain. MAKE SURE YOU:   Understand these instructions.  Will watch your condition.  Will get help right away if you are not doing well or get worse. This information is not intended to replace advice given to you by your health care provider. Make sure you discuss any questions you have with your health care provider. Document Released: 05/20/2005 Document Revised: 02/08/2015 Document Reviewed: 08/27/2013 Elsevier Interactive Patient Education  2017 [employer] Inc[employer]  

## 2016-10-21 NOTE — Progress Notes (Signed)
   Subjective:    Patient ID: Connie Burke, female    DOB: December 06, 1945, 71 y.o.   MRN: [id number]  HPI Pt is a 71 yo female who presents to the clinic to follow up after bells palsy. She was seen on 10/17/16 in UC and given the dx. She continues to have symptoms with the entire left side of her face paralyzed. Her eyes are very dry but the eye drops and gel are helping. She declined prednisone in UC so they sent her valtrex. She has noticed some minimal improvement. She still has trouble eating. She denies any speech issues, limb weakness, headaches, confusion. She denies any recent stressors.    Review of Systems  All other systems reviewed and are negative.      Objective:   Physical Exam  Constitutional: She is oriented to person, place, and time. She appears well-developed and well-nourished.  HENT:  Head: Normocephalic and atraumatic.  Eyes:  Left eye erythematous and watery. No witness blinking.   Cardiovascular: Normal rate, regular rhythm and normal heart sounds.   Pulmonary/Chest: Effort normal and breath sounds normal. She has no wheezes.  Neurological: She is alert and oriented to person, place, and time.  Left face paralysis.   Psychiatric: She has a normal mood and affect. Her behavior is normal.          Assessment & Plan:  Marland KitchenMarland KitchenDiagnoses and all orders for this visit:  Left-sided Bell's palsy -     predniSONE (DELTASONE) 10 MG tablet; Take 6 tablets once a day for 5 days, then 5 tablets for one day, then 4 tablets for one day, then 3 tablets for one day, then 2 tablets for one day then one tablet for one day.  Other orders -     Discontinue: predniSONE (DELTASONE) 10 MG tablet; Take 6 tablets once a day for 5 days, then 5 tablets for one day, then 4 tablets for one day, then 3 tablets for one day, then 2 tablets for one day then one tablet for one day.   Convinced her to start prednisone. Discussed side effects. Discussed duration of symptoms is unknown but likely  to improve faster with prednisone. HO given. Continue to keep left eye lubricated.

## 2016-10-30 ENCOUNTER — Encounter: Payer: Self-pay | Admitting: Physician Assistant

## 2016-10-30 ENCOUNTER — Ambulatory Visit (INDEPENDENT_AMBULATORY_CARE_PROVIDER_SITE_OTHER): Payer: Medicare HMO

## 2016-10-30 ENCOUNTER — Ambulatory Visit (INDEPENDENT_AMBULATORY_CARE_PROVIDER_SITE_OTHER): Payer: Medicare HMO | Admitting: Physician Assistant

## 2016-10-30 VITALS — BP 118/77 | HR 69 | Ht 64.0 in | Wt 131.0 lb

## 2016-10-30 DIAGNOSIS — Z13220 Encounter for screening for lipoid disorders: Secondary | ICD-10-CM

## 2016-10-30 DIAGNOSIS — Z131 Encounter for screening for diabetes mellitus: Secondary | ICD-10-CM | POA: Diagnosis not present

## 2016-10-30 DIAGNOSIS — M816 Localized osteoporosis [Lequesne]: Secondary | ICD-10-CM

## 2016-10-30 DIAGNOSIS — E559 Vitamin D deficiency, unspecified: Secondary | ICD-10-CM | POA: Diagnosis not present

## 2016-10-30 DIAGNOSIS — Z1231 Encounter for screening mammogram for malignant neoplasm of breast: Secondary | ICD-10-CM | POA: Diagnosis not present

## 2016-10-30 DIAGNOSIS — Z0000 Encounter for general adult medical examination without abnormal findings: Secondary | ICD-10-CM

## 2016-10-30 DIAGNOSIS — Z79899 Other long term (current) drug therapy: Secondary | ICD-10-CM | POA: Diagnosis not present

## 2016-10-30 DIAGNOSIS — M810 Age-related osteoporosis without current pathological fracture: Secondary | ICD-10-CM | POA: Diagnosis not present

## 2016-10-30 LAB — COMPLETE METABOLIC PANEL WITH GFR
ALT: 14 U/L (ref 6–29)
AST: 13 U/L (ref 10–35)
Albumin: 4.2 g/dL (ref 3.6–5.1)
Alkaline Phosphatase: 71 U/L (ref 33–130)
BILIRUBIN TOTAL: 0.4 mg/dL (ref 0.2–1.2)
BUN: 14 mg/dL (ref 7–25)
CO2: 24 mmol/L (ref 20–31)
Calcium: 9.7 mg/dL (ref 8.6–10.4)
Chloride: 105 mmol/L (ref 98–110)
Creat: 0.7 mg/dL (ref 0.50–0.99)
GFR, EST NON AFRICAN AMERICAN: 89 mL/min (ref >=60)
GFR, Est African American: >89 mL/min (ref >=60)
GLUCOSE: 81 mg/dL (ref 65–99)
POTASSIUM: 4 mmol/L (ref 3.5–5.3)
Sodium: 142 mmol/L (ref 135–146)
TOTAL PROTEIN: 6.8 g/dL (ref 6.1–8.1)

## 2016-10-30 LAB — LIPID PANEL
Cholesterol: 232 mg/dL — ABNORMAL HIGH (ref <200)
HDL: 75 mg/dL (ref >50)
LDL Cholesterol: 121 mg/dL — ABNORMAL HIGH (ref <100)
Total CHOL/HDL Ratio: 3.1 Ratio (ref <5.0)
Triglycerides: 180 mg/dL — ABNORMAL HIGH (ref <150)
VLDL: 36 mg/dL — ABNORMAL HIGH (ref <30)

## 2016-10-30 NOTE — Progress Notes (Signed)
Subjective:   Connie Burke is a 71 y.o. female who presents for Medicare Annual (Subsequent) preventive examination.  Review of Systems:  Negative for comprehensive review of symptoms.        Objective:     Vitals: BP 118/77   Pulse 69   Ht 5\' 4"  (1.626 m)   Wt 131 lb (59.4 kg)   BMI 22.49 kg/m   Body mass index is 22.49 kg/m.   Tobacco History  Smoking Status  . Never Smoker  Smokeless Tobacco  . Never Used     Counseling given: Not Answered   Past Medical History:  Diagnosis Date  . Osteopetrosis    Past Surgical History:  Procedure Laterality Date  . TUBAL LIGATION     Family History  Problem Relation Age of Onset  . Hypertension Mother   . Arthritis Mother   . Cancer Maternal Aunt        breast   History  Sexual Activity  . Sexual activity: No    Outpatient Encounter Prescriptions as of 10/30/2016  Medication Sig  . nitroGLYCERIN (NITRODUR - DOSED IN MG/24 HR) 0.2 mg/hr patch 1/4 patch to heel daily for tendonitis  . [DISCONTINUED] predniSONE (DELTASONE) 10 MG tablet Take 6 tablets once a day for 5 days, then 5 tablets for one day, then 4 tablets for one day, then 3 tablets for one day, then 2 tablets for one day then one tablet for one day.  . [DISCONTINUED] valACYclovir (VALTREX) 1000 MG tablet Take 1 tablet (1,000 mg total) by mouth 3 (three) times daily.   No facility-administered encounter medications on file as of 10/30/2016.     Activities of Daily Living No flowsheet data found.  Patient Care Team: Lavada Mesi as PCP - General (Family Medicine)    Assessment:    Pt walks daily and lifts light weights.  Exercise Activities and Dietary recommendations    Goals    None     Fall Risk No flowsheet data found. Depression Screen PHQ 2/9 Scores 10/30/2016  PHQ - 2 Score 0     Cognitive Function   .Marland Kitchen 6CIT Screen 10/30/2016  What Year? 0 points  What month? 0 points  What time? 0 points  Count back from 20 0 points   Months in reverse 0 points  Repeat phrase 4 points  Total Score 4          There is no immunization history on file for this patient. Screening Tests Health Maintenance  Topic Date Due  . TETANUS/TDAP  03/16/1967  . INFLUENZA VACCINE  08/13/2017 (Originally 01/01/2017)  . PNA vac Low Risk Adult (1 of 2 - PCV13) 10/30/2021 (Originally 03/15/2013)  . MAMMOGRAM  02/27/2018  . COLONOSCOPY  07/05/2023  . DEXA SCAN  Completed  . Hepatitis C Screening  Completed      Plan:     I have personally reviewed and noted the following in the patient's chart:   . Medical and social history . Use of alcohol, tobacco or illicit drugs-denies any usage of substances.  . Current medications and supplements . Functional ability and status . Nutritional status . Physical activity- pt walks regularly and low weight strength training. . Advanced directives . List of other physicians . Hospitalizations, surgeries, and ER visits in previous 12 months . Vitals . Screenings to include cognitive, depression, and falls . Referrals and appointments  .Marland Kitchen Fall Risk  10/30/2016  Falls in the past year? No   ..  6CIT Screen 10/30/2016  What Year? 0 points  What month? 0 points  What time? 0 points  Count back from 20 0 points  Months in reverse 0 points  Repeat phrase 4 points  Total Score 4    Ordered bone density/mammogram for screening purposes.  Continue on vitamin D and calcium.   Pt declined Tdap at least until bells palsy completely clears.  Pt declined pneumonia vaccines.   In addition, I have reviewed and discussed with patient certain preventive protocols, quality metrics, and best practice recommendations. A written personalized care plan for preventive services as well as general preventive health recommendations were provided to patient.     Iran Planas, PA-C  10/30/2016

## 2016-10-30 NOTE — Progress Notes (Signed)
Call pt: osteoporosis shown. Suggest staying on vitamin D and calcium but need to consider medications that can help build bone like prolia(every 6 months shot or can consider oral medication called fosamax) what are her thoughts?

## 2016-10-30 NOTE — Patient Instructions (Signed)

## 2016-10-31 LAB — VITAMIN D 25 HYDROXY (VIT D DEFICIENCY, FRACTURES): Vit D, 25-Hydroxy: 28 ng/mL — ABNORMAL LOW (ref 30–100)

## 2016-11-01 NOTE — Progress Notes (Signed)
Can we get ordered and scheduled for injection?

## 2016-11-01 NOTE — Progress Notes (Signed)
Call pt: vitamin d low. What dose are you taking currently?  TG went up a little. Watch sugars and carbs. Consider fish oil as a great supplement to help TG.

## 2016-11-06 DIAGNOSIS — R69 Illness, unspecified: Secondary | ICD-10-CM | POA: Diagnosis not present

## 2016-11-13 ENCOUNTER — Ambulatory Visit: Payer: Medicare HMO

## 2016-11-20 DIAGNOSIS — R69 Illness, unspecified: Secondary | ICD-10-CM | POA: Diagnosis not present

## 2016-12-01 DIAGNOSIS — 419620001 Death: Secondary | SNOMED CT | POA: Diagnosis not present

## 2016-12-01 DEATH — deceased

## 2016-12-26 ENCOUNTER — Ambulatory Visit (INDEPENDENT_AMBULATORY_CARE_PROVIDER_SITE_OTHER): Payer: Medicare HMO | Admitting: Family Medicine

## 2016-12-26 ENCOUNTER — Encounter: Payer: Self-pay | Admitting: Family Medicine

## 2016-12-26 VITALS — BP 127/77 | HR 77 | Wt 130.0 lb

## 2016-12-26 DIAGNOSIS — M9262 Juvenile osteochondrosis of tarsus, left ankle: Secondary | ICD-10-CM | POA: Diagnosis not present

## 2016-12-26 DIAGNOSIS — M9261 Juvenile osteochondrosis of tarsus, right ankle: Secondary | ICD-10-CM | POA: Diagnosis not present

## 2016-12-26 DIAGNOSIS — M2580 Other specified joint disorders, unspecified joint: Secondary | ICD-10-CM | POA: Diagnosis not present

## 2016-12-26 NOTE — Patient Instructions (Signed)
(  Thank you for coming in today. Consider PRP therapy.  Schedule with me as needed.  Continue home exercises and Chiropractor.  Use the pads in the left foot to float the sesamoid bones. (Big toe joint).  Consider Scaphoid pads for arch support.

## 2016-12-26 NOTE — Progress Notes (Signed)
Connie Burke is a 71 y.o. female who presents to [facility] [place] today for Achilles tendonitis and foot pain.  Achilles tendonitis and foot pain: Patient continues to have bilateral pain localized to the Achilles tendons and bottom of her feet. She states that the pain is an 8/10 in severity. She has previously tried exercises to stretch and strengthen her feet, but has not found them helpful. She applied nitroglycerin patches to her foot for about 1 week, but did not find it helpful. Patient states that rest, ice, and changing shoes has been helpful in relieving her pain. She has also begun to see a chiropractor to have her foot adjusted. This has been most effective at relieving her symptoms to date. Patient has had foot x-rays done at an outside facility which showed bone spurs around her heel. Patient expressed concern for pain at the area of the sesamoid bones on her right foot.    Past Medical History:  Diagnosis Date  . Osteopetrosis    Past Surgical History:  Procedure Laterality Date  . TUBAL LIGATION     Social History  Substance Use Topics  . Smoking status: Never Smoker  . Smokeless tobacco: Never Used  . Alcohol use 0.0 oz/week     Comment: wine daily   family history includes Arthritis in her mother; Cancer in her maternal aunt; Hypertension in her mother.  ROS as above:  Medications: No current outpatient prescriptions on file.   No current facility-administered medications for this visit.    Allergies  Allergen Reactions  . Amoxicillin     Does not prefer to take due to childs reaction to medication with rash and abdominal pain.     Health Maintenance Health Maintenance  Topic Date Due  . TETANUS/TDAP  12/30/2016 (Originally 03/16/1967)  . INFLUENZA VACCINE  08/13/2017 (Originally 01/01/2017)  . PNA vac Low Risk Adult (1 of 2 - PCV13)  10/30/2021 (Originally 03/15/2013)  . MAMMOGRAM  02/27/2018  . COLONOSCOPY  07/05/2023  . DEXA SCAN  Completed  . Hepatitis C Screening  Completed     Exam:  BP 127/77   Pulse 77   Wt 130 lb (59 kg)   SpO2 98%   BMI 22.31 kg/m  Gen: Well NAD HEENT: EOMI,  MMM Lungs: Normal work of breathing. CTABL Heart: RRR no MRG Abd: NABS, Soft. Nondistended, Nontender Exts: Brisk capillary refill, warm and well perfused.  Right foot:  No swelling noted on inspection, prominent nodule present at insertion of Achilles tendon No tenderness to palpation of prominence at Achilles tendon Normal range of motion on plantar flexion and dorsiflexion, normal ankle inversion and eversion Strength 5/5 on plantar flexion and dorsiflexion  Left foot:  No swelling noted on inspection, prominent nodule present at insertion of Achilles tendon (L < R) No tenderness to palpation of prominence at Achilles tendon Normal range of motion on plantar flexion and dorsiflexion, normal ankle inversion and eversion Strength 5/5 on plantar flexion and dorsiflexion  Reflexes: Knee jerk reflex 2+ bilaterally Achilles reflex 2+ bilaterally   No results found for this or any previous visit (from the past 72 hour(s)). No results found.    Assessment and Plan: 71 y.o. female with achilles tendonitis and foot pain. Patient has found some relief through conservative management with rest, ice, shoe changes, and chiropractor readjustments. It is recommended that patient continue using these techniques. Patient was also advised on shoe selection. She should look  for shoes without a heel cup or with a soft compressible heel cup. A sesamoid float was made and placed in patient's shoe. Patient was also given a scaphoid pad to place in shoes and help support her high arches. Platelet-rich plasma injections were also discussed with the patient.    No orders of the defined types were placed in this encounter.  No orders of the  defined types were placed in this encounter.    Discussed warning signs or symptoms. Please see discharge instructions. Patient expresses understanding.

## 2016-12-27 DIAGNOSIS — M2580 Other specified joint disorders, unspecified joint: Secondary | ICD-10-CM | POA: Insufficient documentation

## 2016-12-27 DIAGNOSIS — M9262 Juvenile osteochondrosis of tarsus, left ankle: Principal | ICD-10-CM

## 2016-12-27 DIAGNOSIS — M9261 Juvenile osteochondrosis of tarsus, right ankle: Secondary | ICD-10-CM | POA: Insufficient documentation

## 2017-01-01 DIAGNOSIS — 419620001 Death: Secondary | SNOMED CT | POA: Diagnosis not present

## 2017-01-01 DEATH — deceased

## 2017-01-08 DIAGNOSIS — M5416 Radiculopathy, lumbar region: Secondary | ICD-10-CM | POA: Diagnosis not present

## 2017-01-08 DIAGNOSIS — M545 Low back pain: Secondary | ICD-10-CM | POA: Diagnosis not present

## 2017-01-09 DIAGNOSIS — M545 Low back pain: Secondary | ICD-10-CM | POA: Diagnosis not present

## 2017-01-09 DIAGNOSIS — M5416 Radiculopathy, lumbar region: Secondary | ICD-10-CM | POA: Diagnosis not present

## 2017-01-10 DIAGNOSIS — M5416 Radiculopathy, lumbar region: Secondary | ICD-10-CM | POA: Diagnosis not present

## 2017-01-10 DIAGNOSIS — M545 Low back pain: Secondary | ICD-10-CM | POA: Diagnosis not present

## 2017-01-16 ENCOUNTER — Encounter: Payer: Self-pay | Admitting: *Deleted

## 2017-01-16 ENCOUNTER — Emergency Department
Admission: EM | Admit: 2017-01-16 | Discharge: 2017-01-16 | Disposition: A | Discharge status: Home / Self Care | Payer: Medicare HMO | Source: Home / Self Care | Attending: Family Medicine | Admitting: Family Medicine

## 2017-01-16 DIAGNOSIS — B0229 Other postherpetic nervous system involvement: Secondary | ICD-10-CM

## 2017-01-16 DIAGNOSIS — B029 Zoster without complications: Secondary | ICD-10-CM

## 2017-01-16 MED ORDER — LIDOCAINE 2 % EX GEL: 1.0000 "application " | Freq: Three times a day (TID) | CUTANEOUS | 0 refills | Status: DC | PRN

## 2017-01-16 MED ORDER — VALACYCLOVIR HCL 1 G PO TABS: 1000.0000 mg | ORAL_TABLET | Freq: Three times a day (TID) | ORAL | 0 refills | Status: DC

## 2017-01-16 MED ORDER — GABAPENTIN 300 MG PO CAPS: ORAL_CAPSULE | ORAL | 0 refills | Status: DC

## 2017-01-16 NOTE — ED Triage Notes (Signed)
Patient c/o painful shingles-like rash to left flank, buttocks, groin and hip x 6 days. She has taken 5 days BID of valacyclovir 1 GM that she had leftover from [place]. Also used calamine. Pain is much worse at night when she lays down.

## 2017-01-16 NOTE — ED Provider Notes (Signed)
[name]    CSN: [id number] Arrival date & time: 01/16/17  0917     History   Chief Complaint Chief Complaint  Patient presents with  . Rash    HPI Connie Burke is a 71 y.o. female.   HPI  Connie Burke is a 71 y.o. female presenting to UC with c/o rash and pain she believes is due to shingles on her Left buttock, groin and hip that has been there for 6 days.  Rash has slowly been improving, drying up and fading but pain is severe at night, "burning like fire."  She has been taking leftover valtrex that she had from [place] a few months ago.  She has taken 5 days of 1g valacyclovir BID.  She has also used calamine lotion.  She notes she recently went to a chiropractor who caused more pain in her lower back and believes that is what caused shingles to start.    Past Medical History:  Diagnosis Date  . Osteopetrosis     Patient Active Problem List   Diagnosis Date Noted  . 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 Sesamoiditis 12/27/2016  . Tendonitis, Achilles, right 07/08/2016  . Left foot pain 07/08/2016  . 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 Precancerous skin lesion 08/14/2015  . Osteoporosis 09/15/2014  . Bunion, left foot 09/02/2014  . Cystocele 08/30/2014    Past Surgical History:  Procedure Laterality Date  . TUBAL LIGATION      OB History    Gravida Para Term Preterm AB Living   4 4 4     4    SAB TAB Ectopic Multiple Live Births                   Home Medications    Prior to Admission medications   Medication Sig Start Date End Date Taking? Authorizing Provider  gabapentin (NEURONTIN) 300 MG capsule Day 1: 300mg  (1 tab), Day 2: 300mg  BID (2 tabs), Day 3+: 300mg  (3 tabs) daily. 01/16/17   Noe Gens, PA-C  Lidocaine 2 % GEL Apply 1 application topically 3 (three) times daily as needed. 01/16/17   Noe Gens, PA-C  valACYclovir (VALTREX) 1000 MG tablet Take 1 tablet (1,000 mg total) by mouth 3 (three) times daily.  01/16/17   Noe Gens, PA-C    Family History Family History  Problem Relation Age of Onset  . Hypertension Mother   . Arthritis Mother   . Cancer Maternal Aunt        breast    Social History Social History  Substance Use Topics  . Smoking status: Never Smoker  . Smokeless tobacco: Never Used  . Alcohol use 0.0 oz/week     Comment: wine daily     Allergies   Amoxicillin   Review of Systems Review of Systems  Constitutional: Negative for chills and fever.  Musculoskeletal: Positive for myalgias ( Left buttock and hip where shingles is). Negative for arthralgias.  Skin: Positive for color change and rash. Negative for wound.  Neurological: Negative for weakness and numbness.     Physical Exam Triage Vital Signs ED Triage Vitals  Enc Vitals Group     BP 01/16/17 0940 125/78     Pulse Rate 01/16/17 0940 68     Resp --      Temp 01/16/17 0940 97.8 F (36.6 C)     Temp Source 01/16/17 0940 Oral     SpO2 01/16/17 0940 100 %  Weight 01/16/17 0940 129 lb (58.5 kg)     Height --      Head Circumference --      Peak Flow --      Pain Score 01/16/17 0941 2     Pain Loc --      Pain Edu? --      Excl. in [place]? --    No data found.   Updated Vital Signs BP 125/78 (BP Location: Left Arm)   Pulse 68   Temp 97.8 F (36.6 C) (Oral)   Wt 129 lb (58.5 kg)   SpO2 100%   BMI 22.14 kg/m   Visual Acuity Right Eye Distance:   Left Eye Distance:   Bilateral Distance:    Right Eye Near:   Left Eye Near:    Bilateral Near:     Physical Exam  Constitutional: She is oriented to person, place, and time. She appears well-developed and well-nourished. No distress.  HENT:  Head: Normocephalic and atraumatic.  Mouth/Throat: Oropharynx is clear and moist.  Eyes: EOM are normal.  Neck: Normal range of motion.  Cardiovascular: Normal rate.   Pulmonary/Chest: Effort normal. No respiratory distress.  Musculoskeletal: Normal range of motion. She exhibits tenderness.  She exhibits no edema.  Left buttock, groin and proximal thigh: tenderness to light touch. Full ROM Left hip. Muscle compartments are soft.   Neurological: She is alert and oriented to person, place, and time.  Skin: Skin is warm and dry. Rash noted. She is not diaphoretic. There is erythema.  Left buttock, groin and proximal thigh: faint erythematous drying maculopapular rash that appears to be healing well. Dermatomal distribution.   Psychiatric: She has a normal mood and affect. Her behavior is normal.  Nursing note and vitals reviewed.    UC Treatments / Results  Labs (all labs ordered are listed, but only abnormal results are displayed) Labs Reviewed - No data to display  EKG  EKG Interpretation None       Radiology No results found.  Procedures Procedures (including critical care time)  Medications Ordered in UC Medications - No data to display   Initial Impression / Assessment and Plan / UC Course  I have reviewed the triage vital signs and the nursing notes.  Pertinent labs & imaging results that were available during my care of the patient were reviewed by me and considered in my medical decision making (see chart for details).     Hx and exam c/w post-herpetic neuralgia without evidence of underlying skin infection.  Final Clinical Impressions(s) / UC Diagnoses   Final diagnoses:  Postherpetic neuralgia  Herpes zoster without complication   Will refill Valtrex for 3 more days TID instead of BID. Will have pt also try lidocaine gel and gabapentin. Pt hesitant about gabapentin. Advised she may slowly titrate the dose up to TID. Pt declined hydrocodone.  Encouraged f/u with PCP in 1 week if not improving. Advised pt postherpetic neuralgia can linger for several weeks.   New Prescriptions Discharge Medication List as of 01/16/2017 10:01 AM    START taking these medications   Details  gabapentin (NEURONTIN) 300 MG capsule Day 1: 300mg  (1 tab), Day 2: 300mg   BID (2 tabs), Day 3+: 300mg  (3 tabs) daily., Normal    Lidocaine 2 % GEL Apply 1 application topically 3 (three) times daily as needed., Starting Thu 01/16/2017, Print    valACYclovir (VALTREX) 1000 MG tablet Take 1 tablet (1,000 mg total) by mouth 3 (three) times daily., Starting Thu  01/16/2017, Normal         Controlled Substance Prescriptions [place] Controlled Substance Registry consulted? Yes, I have consulted the [place] Controlled Substances Registry for this patient, and feel the risk/benefit ratio today is favorable for proceeding with this prescription for a controlled substance.   Noe Gens, [place] 01/16/17 1056

## 2017-01-18 ENCOUNTER — Telehealth: Payer: Self-pay | Admitting: *Deleted

## 2017-01-18 NOTE — Telephone Encounter (Signed)
Callback: patient reports the rash is improving but the pain is still flaring up causing a lot of discomfort. She has not tried the gabapentin yet. I advised her of the benefits and starting with just 1 on the first day. Follow up with PCP if pain is not improving after 1 week from visit.

## 2017-01-24 ENCOUNTER — Telehealth: Payer: Self-pay | Admitting: Physician Assistant

## 2017-01-24 MED ORDER — GABAPENTIN 300 MG PO CAPS: ORAL_CAPSULE | ORAL | 0 refills | Status: DC

## 2017-01-24 NOTE — Telephone Encounter (Signed)
Pt.notified

## 2017-01-24 NOTE — Telephone Encounter (Signed)
Pt called. She states she was seen at [facility] for Shingles(8/16). She has one day left on the Gabapentin rx that was prescribed to her and is requesting a refill. Thank you.

## 2017-01-24 NOTE — Telephone Encounter (Signed)
Sent refill

## 2017-02-21 NOTE — Telephone Encounter (Signed)
error 

## 2017-02-27 DIAGNOSIS — R69 Illness, unspecified: Secondary | ICD-10-CM | POA: Diagnosis not present

## 2017-03-05 ENCOUNTER — Encounter: Payer: Self-pay | Admitting: Physician Assistant

## 2017-03-05 ENCOUNTER — Ambulatory Visit (INDEPENDENT_AMBULATORY_CARE_PROVIDER_SITE_OTHER): Payer: Medicare HMO

## 2017-03-05 DIAGNOSIS — Z1231 Encounter for screening mammogram for malignant neoplasm of breast: Secondary | ICD-10-CM | POA: Diagnosis not present

## 2017-03-05 DIAGNOSIS — R921 Mammographic calcification found on diagnostic imaging of breast: Secondary | ICD-10-CM | POA: Insufficient documentation

## 2017-03-05 NOTE — Progress Notes (Signed)
Call pt: further evaluation is needed to evaluate right breast calcifications. Make sure contacted and imagine scheduled.

## 2017-03-10 ENCOUNTER — Other Ambulatory Visit: Payer: Self-pay | Admitting: Physician Assistant

## 2017-03-10 DIAGNOSIS — R928 Other abnormal and inconclusive findings on diagnostic imaging of breast: Secondary | ICD-10-CM

## 2017-03-12 ENCOUNTER — Ambulatory Visit
Admission: RE | Admit: 2017-03-12 | Discharge: 2017-03-12 | Disposition: A | Discharge status: Home / Self Care | Payer: Medicare HMO | Source: Ambulatory Visit | Attending: Physician Assistant | Admitting: Physician Assistant

## 2017-03-12 ENCOUNTER — Other Ambulatory Visit: Payer: Self-pay | Admitting: Physician Assistant

## 2017-03-12 DIAGNOSIS — R921 Mammographic calcification found on diagnostic imaging of breast: Secondary | ICD-10-CM | POA: Diagnosis not present

## 2017-03-12 DIAGNOSIS — R928 Other abnormal and inconclusive findings on diagnostic imaging of breast: Secondary | ICD-10-CM

## 2017-03-13 ENCOUNTER — Ambulatory Visit
Admission: RE | Admit: 2017-03-13 | Discharge: 2017-03-13 | Disposition: A | Discharge status: Home / Self Care | Payer: Medicare HMO | Source: Ambulatory Visit | Attending: Physician Assistant | Admitting: Physician Assistant

## 2017-03-13 DIAGNOSIS — R928 Other abnormal and inconclusive findings on diagnostic imaging of breast: Secondary | ICD-10-CM

## 2017-03-13 DIAGNOSIS — R921 Mammographic calcification found on diagnostic imaging of breast: Secondary | ICD-10-CM | POA: Diagnosis not present

## 2017-03-13 DIAGNOSIS — D241 Benign neoplasm of right breast: Secondary | ICD-10-CM | POA: Diagnosis not present

## 2017-05-21 DIAGNOSIS — R69 Illness, unspecified: Secondary | ICD-10-CM | POA: Diagnosis not present

## 2017-08-08 ENCOUNTER — Ambulatory Visit (INDEPENDENT_AMBULATORY_CARE_PROVIDER_SITE_OTHER): Payer: Medicare HMO | Admitting: Physician Assistant

## 2017-08-08 ENCOUNTER — Encounter: Payer: Self-pay | Admitting: Physician Assistant

## 2017-08-08 VITALS — BP 121/61 | HR 72 | Ht 64.0 in | Wt 129.0 lb

## 2017-08-08 DIAGNOSIS — L821 Other seborrheic keratosis: Secondary | ICD-10-CM

## 2017-08-08 NOTE — Progress Notes (Signed)
   Subjective:    Patient ID: Connie Burke, female    DOB: [birth year], 72 y.o.   MRN: [id number]  HPI Patient is a 72 year old female with a history of seborrheic keratosis who comes to the clinic to have a few places frozen off today.  These particular lesions are very irritating to her.  She frequently cuts them when shaving and jewelry tends to pull on them.  She has been seen by dermatology in the past and has had other lesions frozen.  .. Active Ambulatory Problems    Diagnosis Date Noted  . Cystocele 08/30/2014  . Bunion, left foot 09/02/2014  . Osteoporosis 09/15/2014  . 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 Precancerous skin lesion 08/14/2015  . Tendonitis, Achilles, right 07/08/2016  . Left foot pain 07/08/2016  . 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 Sesamoiditis 12/27/2016  . Breast calcification, right 03/05/2017   Resolved Ambulatory Problems    Diagnosis Date Noted  . No Resolved Ambulatory Problems   Past Medical History:  Diagnosis Date  . Osteopetrosis       Review of Systems  All other systems reviewed and are negative.      Objective:   Physical Exam  Constitutional: She appears well-developed and well-nourished.  HENT:  Head: Normocephalic and atraumatic.  Cardiovascular: Normal rate, regular rhythm and normal heart sounds.  Skin:  Rough, waxy brown lesions on right and left forearm, left lower calf and right side of upper chest.   Psychiatric: She has a normal mood and affect. Her behavior is normal.          Assessment & Plan:   Marland KitchenMarland KitchenDiagnoses and all orders for this visit:  Seborrheic keratoses   Cryotherapy Procedure Note  Pre-operative Diagnosis: SK's  Post-operative Diagnosis: same   Locations: 2 lesions on left forearm, 1 lesion on right forearm, 1 lesion on left lower calf, 1 lesion on right upper chest.   Indications: irritation/inflammation  Procedure Details  History of allergy to iodine: no. Pacemaker?  no.  Patient informed of risks (permanent scarring, infection, light or dark discoloration, bleeding, infection, weakness, numbness and recurrence of the lesion) and benefits of the procedure and verbal informed consent obtained.  The areas are treated with liquid nitrogen therapy, frozen until ice ball extended 2 mm beyond lesion, allowed to thaw, and treated again. The patient tolerated procedure well.  The patient was instructed on post-op care, warned that there may be blister formation, redness and pain. Recommend OTC analgesia as needed for pain.  Condition: Stable  Complications: none.  Plan: 1. Instructed to keep the area dry and covered for 24-48h and clean thereafter. 2. Warning signs of infection were reviewed.   3. Recommended that the patient use OTC acetaminophen as needed for pain.      Lesions were frozen today without any problems.  Discussed care for blisters if they rupture.  Certainly using sunscreen in the future can help with cumulative sun damage.

## 2017-08-08 NOTE — Patient Instructions (Signed)
Seborrheic Keratosis  Seborrheic keratosis is a common, noncancerous (benign) skin growth. This condition causes waxy, rough, tan, brown, or black spots to appear on the skin. These skin growths can be flat or raised.  What are the causes?  The cause of this condition is not known.  What increases the risk?  This condition is more likely to develop in:  · People who have a family history of seborrheic keratosis.  · People who are 50 or older.  · People who are pregnant.  · People who have had estrogen replacement therapy.    What are the signs or symptoms?  This condition often occurs on the face, chest, shoulders, back, or other areas. These growths:  · Are usually painless, but may become irritated and itchy.  · Can be yellow, brown, black, or other colors.  · Are slightly raised or have a flat surface.  · Are sometimes rough or wart-like in texture.  · Are often waxy on the surface.  · Are round or oval-shaped.  · Sometimes look like they are "stuck on.”  · Often occur in groups, but may occur as a single growth.    How is this diagnosed?  This condition is diagnosed with a medical history and physical exam. A sample of the growth may be tested (skin biopsy). You may need to see a skin specialist (dermatologist).  How is this treated?  Treatment is not usually needed for this condition, unless the growths are irritated or are often bleeding. You may also choose to have the growths removed if you do not like their appearance. Most commonly, these growths are treated with a procedure in which liquid nitrogen is applied to “freeze” off the growth (cryosurgery). They may also be burned off with electricity or cut off.  Follow these instructions at home:  · Watch your growth for any changes.  · Keep all follow-up visits as told by your health care provider. This is important.  · Do not scratch or pick at the growth or growths. This can cause them to become irritated or infected.  Contact a health care provider  if:  · You suddenly have many new growths.  · Your growth bleeds, itches, or hurts.  · Your growth suddenly becomes larger or changes color.  This information is not intended to replace advice given to you by your health care provider. Make sure you discuss any questions you have with your health care provider.  Document Released: 06/22/2010 Document Revised: 10/26/2015 Document Reviewed: 10/05/2014  Elsevier Interactive Patient Education © 2018 [employer]

## 2017-08-21 ENCOUNTER — Other Ambulatory Visit: Payer: Self-pay

## 2017-08-21 ENCOUNTER — Emergency Department
Admission: EM | Admit: 2017-08-21 | Discharge: 2017-08-21 | Disposition: A | Discharge status: Home / Self Care | Payer: Medicare HMO | Source: Home / Self Care | Attending: Family Medicine | Admitting: Family Medicine

## 2017-08-21 ENCOUNTER — Emergency Department (INDEPENDENT_AMBULATORY_CARE_PROVIDER_SITE_OTHER): Payer: Medicare HMO

## 2017-08-21 ENCOUNTER — Encounter: Payer: Self-pay | Admitting: *Deleted

## 2017-08-21 DIAGNOSIS — M25542 Pain in joints of left hand: Secondary | ICD-10-CM | POA: Diagnosis not present

## 2017-08-21 DIAGNOSIS — S6992XA Unspecified injury of left wrist, hand and finger(s), initial encounter: Secondary | ICD-10-CM | POA: Diagnosis not present

## 2017-08-21 DIAGNOSIS — S60222A Contusion of left hand, initial encounter: Secondary | ICD-10-CM | POA: Diagnosis not present

## 2017-08-21 DIAGNOSIS — M79642 Pain in left hand: Secondary | ICD-10-CM | POA: Diagnosis not present

## 2017-08-21 NOTE — Discharge Instructions (Addendum)
Wear light ace wrap to decrease swelling.  May take Advil for pain and swelling.    Apply ice to the injured area: Put ice in a plastic bag. Place a towel between your skin and the bag. Leave the ice on for 20 minutes, 2-3 times a day. Begin gentle range of motion exercises when swelling has resolved.

## 2017-08-21 NOTE — ED Provider Notes (Addendum)
[facility]    CSN: [id number] Arrival date & time: 08/21/17  1153     History   Chief Complaint Chief Complaint  Patient presents with  . Hand Injury    HPI Connie Burke is a 72 y.o. female.   While walking her dog yesterday, patient tripped and hit the dorsum of her left hand.  She has been wearing an ace wrap but complains of persistent mild swelling and pain.  The history is provided by the patient.  Hand Pain  The current episode started yesterday. The problem occurs constantly. The problem has not changed since onset.Exacerbated by: flexion of fingers. Nothing relieves the symptoms. Treatments tried: Ace wrap and Advil. The treatment provided mild relief.    Past Medical History:  Diagnosis Date  . Osteopetrosis     Patient Active Problem List   Diagnosis Date Noted  . Breast calcification, right 03/05/2017  . 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 Sesamoiditis 12/27/2016  . Tendonitis, Achilles, right 07/08/2016  . Left foot pain 07/08/2016  . 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 Precancerous skin lesion 08/14/2015  . Osteoporosis 09/15/2014  . Bunion, left foot 09/02/2014  . Cystocele 08/30/2014    Past Surgical History:  Procedure Laterality Date  . TUBAL LIGATION      OB History    Gravida  4   Para  4   Term  4   Preterm      AB      Living  4     SAB      TAB      Ectopic      Multiple      Live Births               Home Medications    Prior to Admission medications   Not on File    Family History Family History  Problem Relation Age of Onset  . Hypertension Mother   . Arthritis Mother   . Cancer Maternal Aunt        breast    Social History Social History   Tobacco Use  . Smoking status: Never Smoker  . Smokeless tobacco: Never Used  Substance Use Topics  . Alcohol use: Yes    Alcohol/week: 0.0 oz    Comment: wine daily  . Drug use: No     Allergies   Amoxicillin   Review  of Systems Review of Systems  All other systems reviewed and are negative.    Physical Exam Triage Vital Signs ED Triage Vitals  Enc Vitals Group     BP 08/21/17 1214 129/78     Pulse Rate 08/21/17 1214 81     Resp --      Temp --      Temp src --      SpO2 08/21/17 1214 98 %     Weight 08/21/17 1216 130 lb (59 kg)     Height --      Head Circumference --      Peak Flow --      Pain Score 08/21/17 1215 4     Pain Loc --      Pain Edu? --      Excl. in [place]? --    No data found.  Updated Vital Signs BP 129/78 (BP Location: Right Arm)   Pulse 81   Wt 130 lb (59 kg)   SpO2 98%   BMI 22.31 kg/m   Visual Acuity Right Eye Distance:  Left Eye Distance:   Bilateral Distance:    Right Eye Near:   Left Eye Near:    Bilateral Near:     Physical Exam  Constitutional: She appears well-developed and well-nourished. No distress.  Eyes: Pupils are equal, round, and reactive to light.  Cardiovascular: Normal rate.  Pulmonary/Chest: Effort normal.  Musculoskeletal:       Hands: Tenderness, swelling, and ecchymosis dorsum of left hand as noted on diagram.  Patient has difficulty completely flexing MCP joints.  Distal flexion/extension intact.  Distal neurovascular function is intact.   Neurological: She is alert.  Skin: Skin is warm and dry.  Nursing note and vitals reviewed.    UC Treatments / Results  Labs (all labs ordered are listed, but only abnormal results are displayed) Labs Reviewed - No data to display  EKG  EKG Interpretation None       Radiology Dg Hand Complete Left  Result Date: 08/21/2017 CLINICAL DATA:  The patient reports third fourth and fifth MCP joint pain after a fall 1 day ago. The patient found it painful to fan the fingers during positioning. EXAM: LEFT HAND - COMPLETE 3+ VIEW COMPARISON:  None in PACs FINDINGS: The bones are subjectively adequately mineralized. There is no acute fracture nor dislocation. There are moderatechanges of the  DIP and PIP joints diffusely. There is severe joint space loss and proliferative change of the DIP joint of the third finger. There is mild narrowing of the fifth MCP joint. The carpometacarpal joint of the first ray exhibits mild degenerative change. The intercarpal joints are grossly normal. IMPRESSION: There is no acute fracture nor dislocation of the bones of the left hand. 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 Electronically Signed   By: David  Martinique M.D.   On: 08/21/2017 13:02    Procedures Procedures (including critical care time)  Medications Ordered in UC Medications - No data to display   Initial Impression / Assessment and Plan / UC Course  I have reviewed the triage vital signs and the nursing notes.  Pertinent labs & imaging results that were available during my care of the patient were reviewed by me and considered in my medical decision making (see chart for details).    Ace wrap applied. Wear light ace wrap to decrease swelling.  May take Advil for pain and swelling.     Apply ice to the injured area: ? Put ice in a plastic bag. ? Place a towel between your skin and the bag. ? Leave the ice on for 20 minutes, 2-3 times a day. Begin gentle range of motion exercises when swelling has resolved. Followup with Dr. Aundria Mems or Dr. Lynne Leader ([facility]) if not improving about two weeks.     Final Clinical Impressions(s) / UC Diagnoses   Final diagnoses:  Contusion of left hand, initial encounter    ED Discharge Orders    None         Kandra Nicolas, MD 08/23/17 1655    Kandra Nicolas, MD 08/23/17 [phone]

## 2017-08-21 NOTE — ED Triage Notes (Signed)
Patient reports tripping and falling while walking her dog yesterday. She has bruising to her left 3rd-5th fingers and pain in the medial part of that hand. No previous injury. Taken Advil.

## 2017-10-31 ENCOUNTER — Encounter: Payer: Self-pay | Admitting: Physician Assistant

## 2017-10-31 ENCOUNTER — Ambulatory Visit (INDEPENDENT_AMBULATORY_CARE_PROVIDER_SITE_OTHER): Payer: Medicare HMO | Admitting: Physician Assistant

## 2017-10-31 VITALS — BP 116/75 | HR 74 | Ht 64.0 in | Wt 128.0 lb

## 2017-10-31 DIAGNOSIS — Z13220 Encounter for screening for lipoid disorders: Secondary | ICD-10-CM

## 2017-10-31 DIAGNOSIS — E781 Pure hyperglyceridemia: Secondary | ICD-10-CM | POA: Diagnosis not present

## 2017-10-31 DIAGNOSIS — Z131 Encounter for screening for diabetes mellitus: Secondary | ICD-10-CM

## 2017-10-31 DIAGNOSIS — M810 Age-related osteoporosis without current pathological fracture: Secondary | ICD-10-CM

## 2017-10-31 DIAGNOSIS — L814 Other melanin hyperpigmentation: Secondary | ICD-10-CM | POA: Diagnosis not present

## 2017-10-31 DIAGNOSIS — Z0000 Encounter for general adult medical examination without abnormal findings: Secondary | ICD-10-CM | POA: Diagnosis not present

## 2017-10-31 NOTE — Progress Notes (Signed)
Subjective:   Connie Burke is a 72 y.o. female who presents for Medicare Annual (Subsequent) preventive examination.  Review of Systems:  She has multiple age spots that she is concerned about. She request dermatologist.        Objective:     Vitals: BP 116/75   Pulse 74   Ht 5\' 4"  (1.626 m)   Wt 128 lb (58.1 kg)   BMI 21.97 kg/m   Body mass index is 21.97 kg/m.  Advanced Directives 08/14/2015 08/14/2015  Does Patient Have a Medical Advance Directive? Yes Yes  Type of Advance Directive - Living will    Tobacco Social History   Tobacco Use  Smoking Status Never Smoker  Smokeless Tobacco Never Used     Counseling given: Not Answered   Clinical Intake:  Heart: NSR, no murmurs,  Lungs:Clear to auscultation.  SKIN: multiple age spots.                        Past Medical History:  Diagnosis Date  . Osteopetrosis    Past Surgical History:  Procedure Laterality Date  . TUBAL LIGATION     Family History  Problem Relation Age of Onset  . Hypertension Mother   . Arthritis Mother   . Cancer Maternal Aunt        breast   Social History   Socioeconomic History  . Marital status: Single    Spouse name: Not on file  . Number of children: Not on file  . Years of education: Not on file  . Highest education level: Not on file  Occupational History  . Occupation: Engineer, materials  Social Needs  . Financial resource strain: Not on file  . Food insecurity:    Worry: Not on file    Inability: Not on file  . Transportation needs:    Medical: Not on file    Non-medical: Not on file  Tobacco Use  . Smoking status: Never Smoker  . Smokeless tobacco: Never Used  Substance and Sexual Activity  . Alcohol use: Yes    Alcohol/week: 0.0 oz    Comment: wine daily  . Drug use: No  . Sexual activity: Never  Lifestyle  . Physical activity:    Days per week: Not on file    Minutes per session: Not on file  . Stress: Not on file  Relationships  . Social  connections:    Talks on phone: Not on file    Gets together: Not on file    Attends religious service: Not on file    Active member of club or organization: Not on file    Attends meetings of clubs or organizations: Not on file    Relationship status: Not on file  Other Topics Concern  . Not on file  Social History Narrative  . Not on file    No outpatient encounter medications on file as of 10/31/2017.   No facility-administered encounter medications on file as of 10/31/2017.     Activities of Daily Living In your present state of health, do you have any difficulty performing the following activities: 11/02/2017  Hearing? N  Vision? N  Difficulty concentrating or making decisions? N  Walking or climbing stairs? N  Dressing or bathing? N  Doing errands, shopping? N  Some recent data might be hidden    Patient Care Team: Lavada Mesi as PCP - General (Family Medicine)    Assessment:   This is  a routine wellness examination for Connie Burke.  Exercise Activities and Dietary recommendations    Goals    None      Fall Risk Fall Risk  10/30/2016  Falls in the past year? No   Is the patient's home free of loose throw rugs in walkways, pet beds, electrical cords, etc?   yes      Grab bars in the bathroom? no      Handrails on the stairs?   yes      Adequate lighting?   yes  Timed Get Up and Go performed:   Depression Screen PHQ 2/9 Scores 12/26/2016 10/30/2016 10/30/2016  PHQ - 2 Score 3 0 0  PHQ- 9 Score 3 - -     Cognitive Function     6CIT Screen 10/31/2017 10/30/2016  What Year? 0 points 0 points  What month? 0 points 0 points  What time? 0 points 0 points  Count back from 20 0 points 0 points  Months in reverse 0 points 0 points  Repeat phrase 2 points 4 points  Total Score 2 4     There is no immunization history on file for this patient.  Qualifies for Shingles Vaccine?not yet. She just had shingles last summer 2018.   Screening Tests Health  Maintenance  Topic Date Due  . TETANUS/TDAP  11/01/2018 (Originally 03/16/1965)  . PNA vac Low Risk Adult (1 of 2 - PCV13) 10/30/2021 (Originally 03/15/2013)  . INFLUENZA VACCINE  01/01/2018  . MAMMOGRAM  03/12/2018  . COLONOSCOPY  07/05/2023  . DEXA SCAN  Completed  . Hepatitis C Screening  Completed    Cancer Screenings: Lung: Low Dose CT Chest recommended if Age 66-80 years, 30 pack-year currently smoking OR have quit w/in 15years. Patient does not qualify. Breast:  Up to date on Mammogram? Yes   Up to date of Bone Density/Dexa? Yes Colorectal: up to date.   Additional Screenings: done Hepatitis C Screening:      Plan:     I have personally reviewed and noted the following in the patient's chart:   . Medical and social history . Use of alcohol, tobacco or illicit drugs  . Current medications and supplements . Functional ability and status . Nutritional status . Physical activity . Advanced directives . List of other physicians . Hospitalizations, surgeries, and ER visits in previous 12 months . Vitals . Screenings to include cognitive, depression, and falls . Referrals and appointments  In addition, I have reviewed and discussed with patient certain preventive protocols, quality metrics, and best practice recommendations. A written personalized care plan for preventive services as well as general preventive health recommendations were provided to patient.   [facility] referral for skin checks. Reassurance that most of what I see is benign.   .. Discussed 150 minutes of exercise a week.  Encouraged vitamin D 1000 units and Calcium 1300mg  or 4 servings of dairy a day.  Fasting labs ordered.   Iran Planas, PA-C

## 2017-10-31 NOTE — Patient Instructions (Signed)
Consider prolia.  Consider shingrix after 1 year since shingles.   Keeping You Healthy  Get These Tests  Blood Pressure- Have your blood pressure checked by your healthcare provider at least once a year.  Normal blood pressure is 120/80.  Weight- Have your body mass index (BMI) calculated to screen for obesity.  BMI is a measure of body fat based on height and weight.  You can calculate your own BMI at [employer].it  Cholesterol- Have your cholesterol checked every year.  Diabetes- Have your blood sugar checked every year if you have high blood pressure, high cholesterol, a family history of diabetes or if you are overweight.  Pap Test - Have a pap test every 1 to 5 years if you have been sexually active.  If you are older than 65 and recent pap tests have been normal you may not need additional pap tests.  In addition, if you have had a hysterectomy  for benign disease additional pap tests are not necessary.  Mammogram-Yearly mammograms are essential for early detection of breast cancer  Screening for Colon Cancer- Colonoscopy starting at age 80. Screening may begin sooner depending on your family history and other health conditions.  Follow up colonoscopy as directed by your Gastroenterologist.  Screening for Osteoporosis- Screening begins at age 11 with bone density scanning, sooner if you are at higher risk for developing Osteoporosis.  Get these medicines  Calcium with Vitamin D- Your body requires 1200-1500 mg of Calcium a day and [phone] IU of Vitamin D a day.  You can only absorb 500 mg of Calcium at a time therefore Calcium must be taken in 2 or 3 separate doses throughout the day.  Hormones- Hormone therapy has been associated with increased risk for certain cancers and heart disease.  Talk to your healthcare provider about if you need relief from menopausal symptoms.  Aspirin- Ask your healthcare provider about taking Aspirin to prevent Heart Disease and  Stroke.  Get these Immuniztions  Flu shot- Every fall  Pneumonia shot- Once after the age of 15; if you are younger ask your healthcare provider if you need a pneumonia shot.  Tetanus- Every ten years.  Zostavax- Once after the age of 15 to prevent shingles.  Take these steps  Don't smoke- Your healthcare provider can help you quit. For tips on how to quit, ask your healthcare provider or go to www.smokefree.gov or call 1-800 QUIT-NOW.  Be physically active- Exercise 5 days a week for a minimum of 30 minutes.  If you are not already physically active, start slow and gradually work up to 30 minutes of moderate physical activity.  Try walking, dancing, bike riding, swimming, etc.  Eat a healthy diet- Eat a variety of healthy foods such as fruits, vegetables, whole grains, low fat milk, low fat cheeses, yogurt, lean meats, chicken, fish, eggs, dried beans, tofu, etc.  For more information go to www.thenutritionsource.org  Dental visit- Brush and floss teeth twice daily; visit your dentist twice a year.  Eye exam- Visit your Optometrist or Ophthalmologist yearly.  Drink alcohol in moderation- Limit alcohol intake to one drink or less a day.  Never drink and drive.  Depression- Your emotional health is as important as your physical health.  If you're feeling down or losing interest in things you normally enjoy, please talk to your healthcare provider.  Seat Belts- can save your life; always wear one  Smoke/Carbon Monoxide detectors- These detectors need to be installed on the appropriate level of your  home.  Replace batteries at least once a year.  Violence- If anyone is threatening or hurting you, please tell your healthcare provider.  Living Will/ Health care power of attorney- Discuss with your healthcare provider and family.

## 2017-11-02 ENCOUNTER — Encounter: Payer: Self-pay | Admitting: Physician Assistant

## 2017-11-02 DIAGNOSIS — E781 Pure hyperglyceridemia: Secondary | ICD-10-CM | POA: Insufficient documentation

## 2017-11-02 LAB — COMPLETE METABOLIC PANEL WITH GFR
AG RATIO: 1.8 (calc) (ref 1.0–2.5)
ALBUMIN MSPROF: 4.4 g/dL (ref 3.6–5.1)
ALT: 12 U/L (ref 6–29)
AST: 12 U/L (ref 10–35)
Alkaline phosphatase (APISO): 81 U/L (ref 33–130)
BUN: 16 mg/dL (ref 7–25)
CALCIUM: 9.6 mg/dL (ref 8.6–10.4)
CO2: 28 mmol/L (ref 20–32)
Chloride: 106 mmol/L (ref 98–110)
Creat: 0.7 mg/dL (ref 0.50–0.99)
GFR, EST NON AFRICAN AMERICAN: 88 mL/min/{1.73_m2} (ref >=60)
GFR, Est African American: 102 mL/min/{1.73_m2} (ref >=60)
GLOBULIN: 2.5 g/dL (ref 1.9–3.7)
Glucose, Bld: 87 mg/dL (ref 65–99)
POTASSIUM: 4.3 mmol/L (ref 3.5–5.3)
SODIUM: 141 mmol/L (ref 135–146)
TOTAL PROTEIN: 6.9 g/dL (ref 6.1–8.1)
Total Bilirubin: 0.5 mg/dL (ref 0.2–1.2)

## 2017-11-02 LAB — VITAMIN D 1,25 DIHYDROXY
VITAMIN D 1, 25 (OH) TOTAL: 62 pg/mL (ref 18–72)
VITAMIN D3 1, 25 (OH): 62 pg/mL

## 2017-11-02 LAB — LIPID PANEL W/REFLEX DIRECT LDL
CHOLESTEROL: 224 mg/dL — AB (ref <200)
HDL: 72 mg/dL (ref >50)
LDL Cholesterol (Calc): 131 mg/dL (calc) — ABNORMAL HIGH
Non-HDL Cholesterol (Calc): 152 mg/dL (calc) — ABNORMAL HIGH (ref <130)
TRIGLYCERIDES: 99 mg/dL (ref <150)
Total CHOL/HDL Ratio: 3.1 (calc) (ref <5.0)

## 2017-11-03 ENCOUNTER — Encounter: Payer: Self-pay | Admitting: Physician Assistant

## 2017-11-03 DIAGNOSIS — E785 Hyperlipidemia, unspecified: Secondary | ICD-10-CM | POA: Insufficient documentation

## 2017-11-03 NOTE — Progress Notes (Signed)
Call pt: LDL not quite to goal but HDL is great. Overall cholesterol 10 year CV risk is 6.9 percent. Anything over 7.5 percent we suggest medication treatment. You are not there yet. Will continue to check yearly. Vitamin D is great. Negative for any problems with kidney liver, glucose.

## 2017-11-06 ENCOUNTER — Telehealth: Payer: Self-pay | Admitting: *Deleted

## 2017-11-10 ENCOUNTER — Encounter: Payer: Self-pay | Admitting: Physician Assistant

## 2018-03-05 DIAGNOSIS — L821 Other seborrheic keratosis: Secondary | ICD-10-CM | POA: Diagnosis not present

## 2018-03-05 DIAGNOSIS — Z08 Encounter for follow-up examination after completed treatment for malignant neoplasm: Secondary | ICD-10-CM | POA: Diagnosis not present

## 2018-03-05 DIAGNOSIS — Z85828 Personal history of other malignant neoplasm of skin: Secondary | ICD-10-CM | POA: Diagnosis not present

## 2018-03-05 DIAGNOSIS — D1801 Hemangioma of skin and subcutaneous tissue: Secondary | ICD-10-CM | POA: Diagnosis not present

## 2018-04-06 ENCOUNTER — Encounter: Payer: Self-pay | Admitting: Physician Assistant

## 2018-04-06 ENCOUNTER — Ambulatory Visit (INDEPENDENT_AMBULATORY_CARE_PROVIDER_SITE_OTHER): Payer: Medicare HMO | Admitting: Physician Assistant

## 2018-04-06 VITALS — BP 119/57 | HR 66 | Ht 64.0 in | Wt 129.0 lb

## 2018-04-06 DIAGNOSIS — M720 Palmar fascial fibromatosis [Dupuytren]: Secondary | ICD-10-CM

## 2018-04-06 DIAGNOSIS — Z79899 Other long term (current) drug therapy: Secondary | ICD-10-CM

## 2018-04-06 DIAGNOSIS — B351 Tinea unguium: Secondary | ICD-10-CM | POA: Diagnosis not present

## 2018-04-06 MED ORDER — TERBINAFINE HCL 250 MG PO TABS
250.0000 mg | ORAL_TABLET | Freq: Every day | ORAL | 1 refills | Status: AC
Start: 2018-04-06 — End: 2018-06-29

## 2018-04-06 NOTE — Progress Notes (Signed)
   Subjective:    Patient ID: Connie Burke, female    DOB: 05/22/1946, 72 y.o.   MRN: [id number]  HPI  Patient is a 72 year old female with 2 concerns today.  Her first concern is all thickening and yellow great right toe nail.  She also has a bump in the palm of her left hand with some discomfort at times.  She has noticed the discoloration and thickening of her great right lateral toenail for the last few weeks.  She is use over-the-counter antifungal nail polish and spray.  She is seeing little results.  She really wants her toenails to look good for the summer.  She has been told it can take some time for the fungus to resolve.  She admits to getting pedicures regularly.  She is also noticed a bump in the middle of her right hand that is tender to palpation.  She feels a little bit of strain in her left middle finger with full extension.  She denies any trauma.  She does crochet regularly and daily.  She has not done anything else to make better.  She notices the most discomfort when she is driving with her left hand.  .. Active Ambulatory Problems    Diagnosis Date Noted  . Cystocele 08/30/2014  . Bunion, left foot 09/02/2014  . Osteoporosis 09/15/2014  . 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 Precancerous skin lesion 08/14/2015  . Tendonitis, Achilles, right 07/08/2016  . Left foot pain 07/08/2016  . 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 Sesamoiditis 12/27/2016  . Breast calcification, right 03/05/2017  . Age spots 10/31/2017  . Hyperlipidemia 11/03/2017  . Dupuytren contracture 04/06/2018  . Onychomycosis 04/06/2018   Resolved Ambulatory Problems    Diagnosis Date Noted  . Hypertriglyceridemia 11/02/2017   Past Medical History:  Diagnosis Date  . Osteopetrosis     Review of Systems    see HPI.  Objective:   Physical Exam  Constitutional: She is oriented to person, place, and time. She appears well-developed and well-nourished.  HENT:  Head: Normocephalic and  atraumatic.  Musculoskeletal:  Left palm pronounced tendon with 3rd metatarsal and center mass in the tendon. No warmth or swelling. Tender to palpation. 3 metacarpal is able to be fully extended.   Neurological: She is alert and oriented to person, place, and time.  Skin:  Right great lateral toenail thick and yellow compared to the rest of her toenails.   Psychiatric: She has a normal mood and affect. Her behavior is normal.          Assessment & Plan:  Marland KitchenMarland KitchenDiagnoses and all orders for this visit:  Onychomycosis -     terbinafine (LAMISIL) 250 MG tablet; Take 1 tablet (250 mg total) by mouth daily.  Medication management -     Hepatic function panel  Dupuytren contracture   Liver enzymes reviewed and look great. Pt already failed OTC anti-fungal. Start lamisil daily for 3-6 months. Discussed can be a long process. HO given. Check hepatic function in 4-6 weeks.   PE seems like for grade I dupuytren contracture. Stop crocheting for now. Follow up with Dr. Georgina Snell or Dr. Darene Lamer for consult and treatment plan.

## 2018-04-06 NOTE — Patient Instructions (Signed)
Dupuytren Contracture Dupuytren contracture is a condition in which tissue under the skin of the palm becomes abnormally thickened. This causes one or more of the fingers to curl inward (contract) toward the palm. Eventually, the fingers may not be able to straighten out. This condition affects some or all of the fingers and the palm of the hand. It is often passed along from parent to child (inherited). Dupuytren contracture is a long-term (chronic) condition that develops (progresses) slowly over time. There is no cure, but symptoms can be managed and progression can be slowed with treatment. This condition is usually not dangerous or painful, but it can interfere with everyday tasks. What are the causes? This condition is caused by tissue (fascia) in the palm getting thicker and tighter. When the fascia thickens, it pulls on the cords of tissue (tendons) that control finger movement. This causes the fingers to contract. The cause of fascia thickening is not known. What increases the risk? This condition may be more likely to develop in:  People who are age 52 or older.  Men.  People with a family history of this condition.  People who use tobacco products, including cigarettes, chewing tobacco, and e-cigarettes.  People who drink alcohol excessively.  People with diabetes.  People with autoimmune diseases, such as HIV.  People with seizure disorders.  What are the signs or symptoms? Symptoms may develop in one or both hands. Any of the fingers can contract. The fingers farthest from the thumb are commonly affected. Usually, this condition is painless. You may have discomfort when holding or grabbing objects. Early symptoms of this condition may include:  Thick, puckered skin on the hand.  One or more lumps (nodules) on the palm. Nodules may be tender when they first appear, but they are generally painless.  Symptoms of this condition develop slowly over months or years.  Later symptoms of this condition may include:  Thick cords of tissue in the palm.  Fingers curled up toward the palm.  Inability to straighten the fingers into their normal position.  How is this diagnosed? This condition is diagnosed with a physical exam, which may include:  Looking at your hands and feeling your hands. This is to check for thickened fascia and nodules.  Measuring finger motion.  Doing the [employer]. You may be asked to try to put your hand on a surface, with your palm down and your fingers straight out.  How is this treated? There is no cure for this condition, but treatment can make symptoms more manageable and relieve discomfort. Treatment options may include:  Physical therapy. This can strengthen your hand and increase flexibility.  Occupational therapy. This can help you with everyday tasks that may be more difficult because of your condition.  A hand splint.  Shots (injections). Substances may be injected into your hand, such as: ? Medicines that help to decrease swelling (corticosteroids). ? Proteins (collagenase) to weaken thick tissue. After a collagenase injection, your health care provider may stretch your fingers.  Needle aponeurotomy. In this procedure, a needle is pushed through the skin and into the fascia. Moving the needle against the fascia can weaken or break up the thick tissue.  Surgery. This may be needed if your condition causes discomfort or interferes with everyday activities. Physical therapy is usually needed after surgery.  In some cases, symptoms never develop to the point of needing major treatment, and caring for yourself at home can be enough to manage your condition.  Symptoms often return after treatment. Follow these instructions at home: If you have a splint:  Do not put pressure on any part of the splint until it is fully hardened. This may take several hours.  Wear the splint as told by your health care  provider. Remove it only as told by your health care provider.  Loosen the splint if your fingers tingle, become numb, or turn cold and blue.  Do not let your splint get wet if it is not waterproof. ? If your splint is not waterproof, cover it with a watertight covering when you take a bath or a shower. ? Do not take baths, swim, or use a hot tub until your health care provider approves. Ask your health care provider if you can take showers. You may only be allowed to take sponge baths for bathing.  Keep the splint clean.  Ask your health care provider when it is safe to drive. Hand Care  Take these actions to help protect your hand from possible injury: ? Use tools that have padded grips. ? Wear protective gloves while you work with your hands. ? Avoid repetitive hand movements.  Avoid actions that cause pain or discomfort.  Stretch your hand by gently pulling your fingers backward toward your wrist. Do this as often as is comfortable. Stop if this causes pain.  Gently massage your hand as often as is comfortable.  If directed, apply heat to the affected area as often as told by your health care provider. Use the heat source that your health care provider recommends, such as a moist heat pack or a heating pad. ? Place a towel between your skin and the heat source. ? Leave the heat on for 20-30 minutes. ? Remove the heat if your skin turns bright red. This is especially important if you are unable to feel pain, heat, or cold. You may have a greater risk of getting burned. General instructions  Take over-the-counter and prescription medicines only as told by your health care provider.  Manage any other conditions that you have, such as diabetes.  If physical therapy was prescribed, do exercises as told by your health care provider.  Keep all follow-up visits as told by your health care provider. This is important. Contact a health care provider if:  You develop new symptoms, or  your symptoms get worse.  You have pain that gets worse or does not get better with medicine.  You have difficulty or discomfort with everyday tasks.  You have problems with your splint.  You develop numbness or tingling. Get help right away if:  You have severe pain.  Your fingers change color or become unusually cold. This information is not intended to replace advice given to you by your health care provider. Make sure you discuss any questions you have with your health care provider. Document Released: 03/17/2009 Document Revised: 07/04/2015 Document Reviewed: 10/12/2014 Elsevier Interactive Patient Education  2018 [place]. Fungal Nail Infection Fungal nail infection is a common fungal infection of the toenails or fingernails. This condition affects toenails more often than fingernails. More than one nail may be infected. The condition can be passed from person to person (is contagious). What are the causes? This condition is caused by a fungus. Several types of funguses can cause the infection. These funguses are common in moist and warm areas. If your hands or feet come into contact with the fungus, it may get into a crack in your fingernail or toenail and cause  the infection. What increases the risk? The following factors may make you more likely to develop this condition:  Being female.  Having diabetes.  Being of older age.  Living with someone who has the fungus.  Walking barefoot in areas where the fungus thrives, such as showers or locker rooms.  Having poor circulation.  Wearing shoes and socks that cause your feet to sweat.  Having athlete's foot.  Having a nail injury or history of a recent nail surgery.  Having psoriasis.  Having a weak body defense system (immune system).  What are the signs or symptoms? Symptoms of this condition include:  A pale spot on the nail.  Thickening of the nail.  A nail that becomes yellow or brown.  A brittle or  ragged nail edge.  A crumbling nail.  A nail that has lifted away from the nail bed.  How is this diagnosed? This condition is diagnosed with a physical exam. Your health care provider may take a scraping or clipping from your nail to test for the fungus. How is this treated? Mild infections do not need treatment. If you have significant nail changes, treatment may include:  Oral antifungal medicines. You may need to take the medicine for several weeks or several months, and you may not see the results for a long time. These medicines can cause side effects. Ask your health care provider what problems to watch for.  Antifungal nail polish and nail cream. These may be used along with oral antifungal medicines.  Laser treatment of the nail.  Surgery to remove the nail. This may be needed for the most severe infections.  Treatment takes a long time, and the infection may come back. Follow these instructions at home: Medicines  Take or apply over-the-counter and prescription medicines only as told by your health care provider.  Ask your health care provider about using over-the-counter mentholated ointment on your nails. Lifestyle   Do not share personal items, such as towels or nail clippers.  Trim your nails often.  Wash and dry your hands and feet every day.  Wear absorbent socks, and change your socks frequently.  Wear shoes that allow air to circulate, such as sandals or canvas tennis shoes. Throw out old shoes.  Wear rubber gloves if you are working with your hands in wet areas.  Do not walk barefoot in shower rooms or locker rooms.  Do not use a nail salon that does not use clean instruments.  Do not use artificial nails. General instructions  Keep all follow-up visits as told by your health care provider. This is important.  Use antifungal foot powder on your feet and in your shoes. Contact a health care provider if: Your infection is not getting better or it  is getting worse after several months. This information is not intended to replace advice given to you by your health care provider. Make sure you discuss any questions you have with your health care provider. Document Released: 05/17/2000 Document Revised: 10/26/2015 Document Reviewed: 11/21/2014 Elsevier Interactive Patient Education  2018 [employer].

## 2018-04-06 NOTE — Telephone Encounter (Signed)
Opened in error

## 2018-04-09 ENCOUNTER — Telehealth: Payer: Self-pay | Admitting: Family Medicine

## 2018-04-09 ENCOUNTER — Ambulatory Visit: Payer: Medicare HMO | Admitting: Family Medicine

## 2018-04-09 NOTE — Telephone Encounter (Signed)
Connie Burke just called at 83. She is unable to make her appt. Her mother in law passed away this morning.

## 2018-04-17 ENCOUNTER — Other Ambulatory Visit: Payer: Self-pay | Admitting: Physician Assistant

## 2018-04-17 ENCOUNTER — Ambulatory Visit
Admission: RE | Admit: 2018-04-17 | Discharge: 2018-04-17 | Disposition: A | Discharge status: Home / Self Care | Payer: Medicare HMO | Source: Ambulatory Visit | Attending: Physician Assistant | Admitting: Physician Assistant

## 2018-04-17 DIAGNOSIS — Z1231 Encounter for screening mammogram for malignant neoplasm of breast: Secondary | ICD-10-CM

## 2018-04-23 ENCOUNTER — Ambulatory Visit: Payer: Medicare HMO | Admitting: Family Medicine

## 2018-04-24 NOTE — Progress Notes (Signed)
Call pt: normal mammogram. Follow up in 1 year.

## 2018-05-04 ENCOUNTER — Ambulatory Visit: Payer: Medicare HMO | Admitting: Physician Assistant

## 2018-06-12 IMAGING — MG DIGITAL DIAGNOSTIC UNILATERAL RIGHT MAMMOGRAM
3 series · 3 of 3 positions shown · non-contrast
Comparison: Previous exam(s).

CLINICAL DATA: Small group of calcifications in the upper inner
quadrant of the right breast on a recent screening mammogram.

EXAM:
DIGITAL DIAGNOSTIC RIGHT MAMMOGRAM WITH CAD

[R CC]
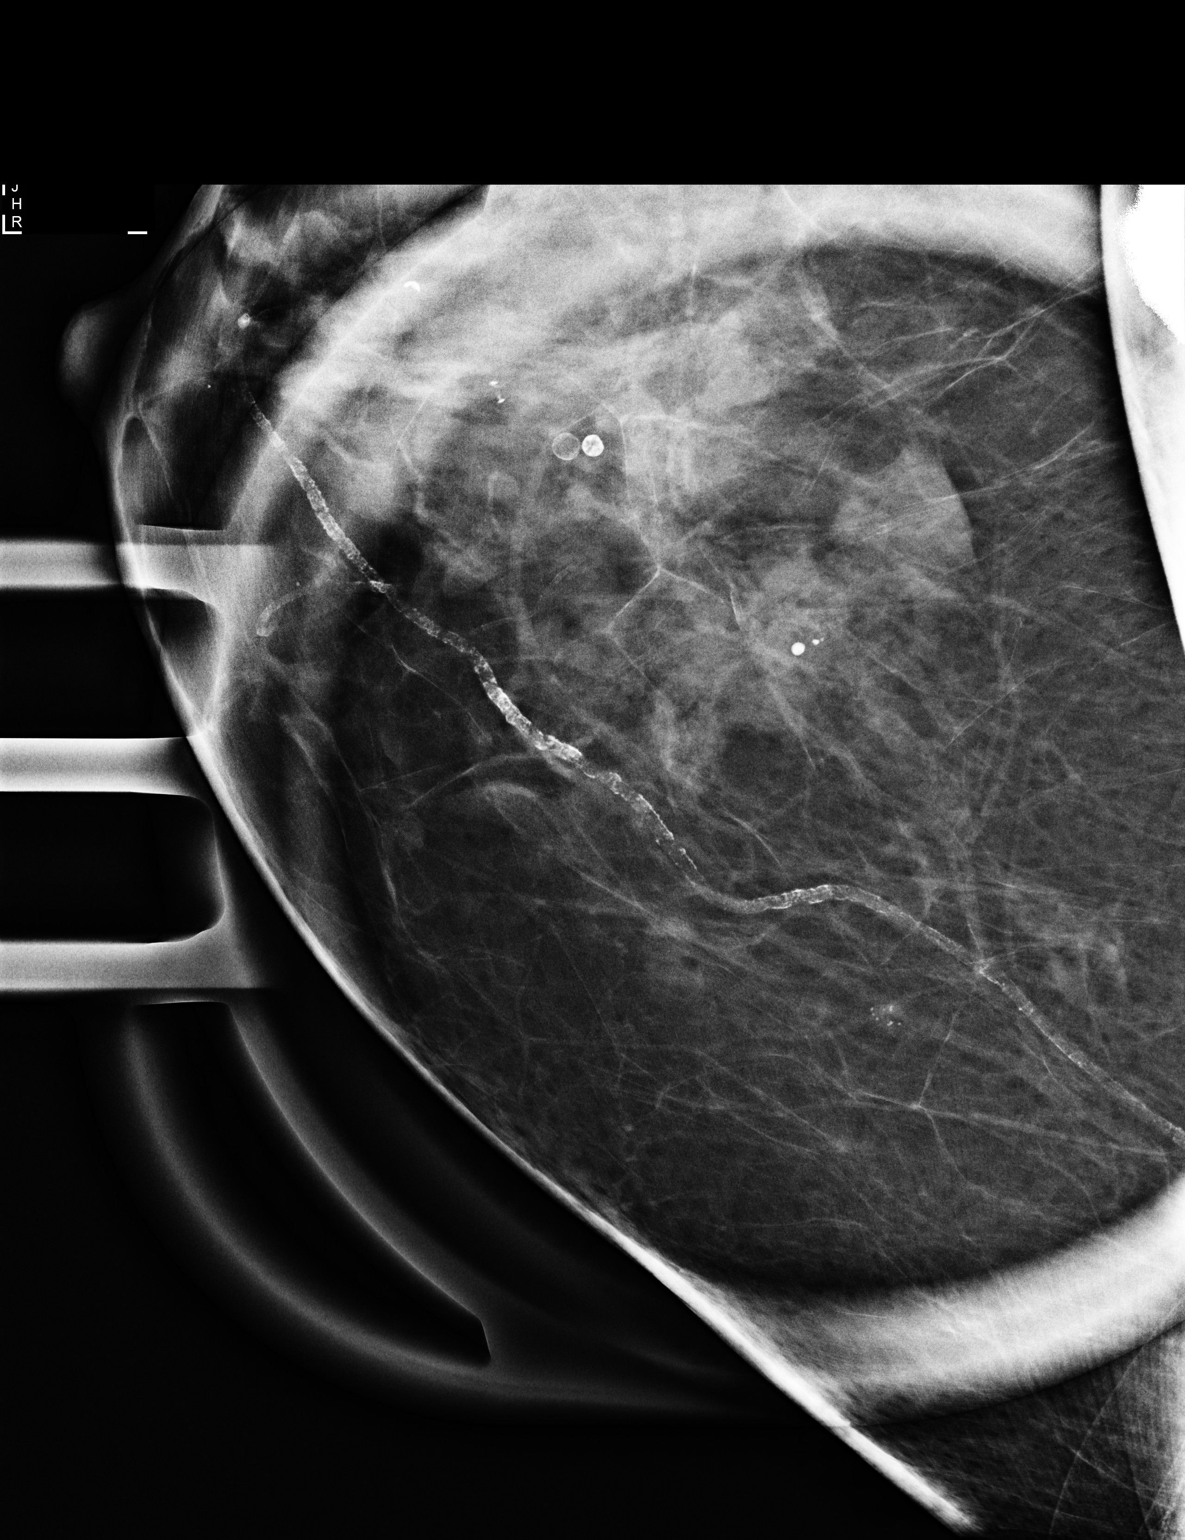

[R ML (1 of 2)]
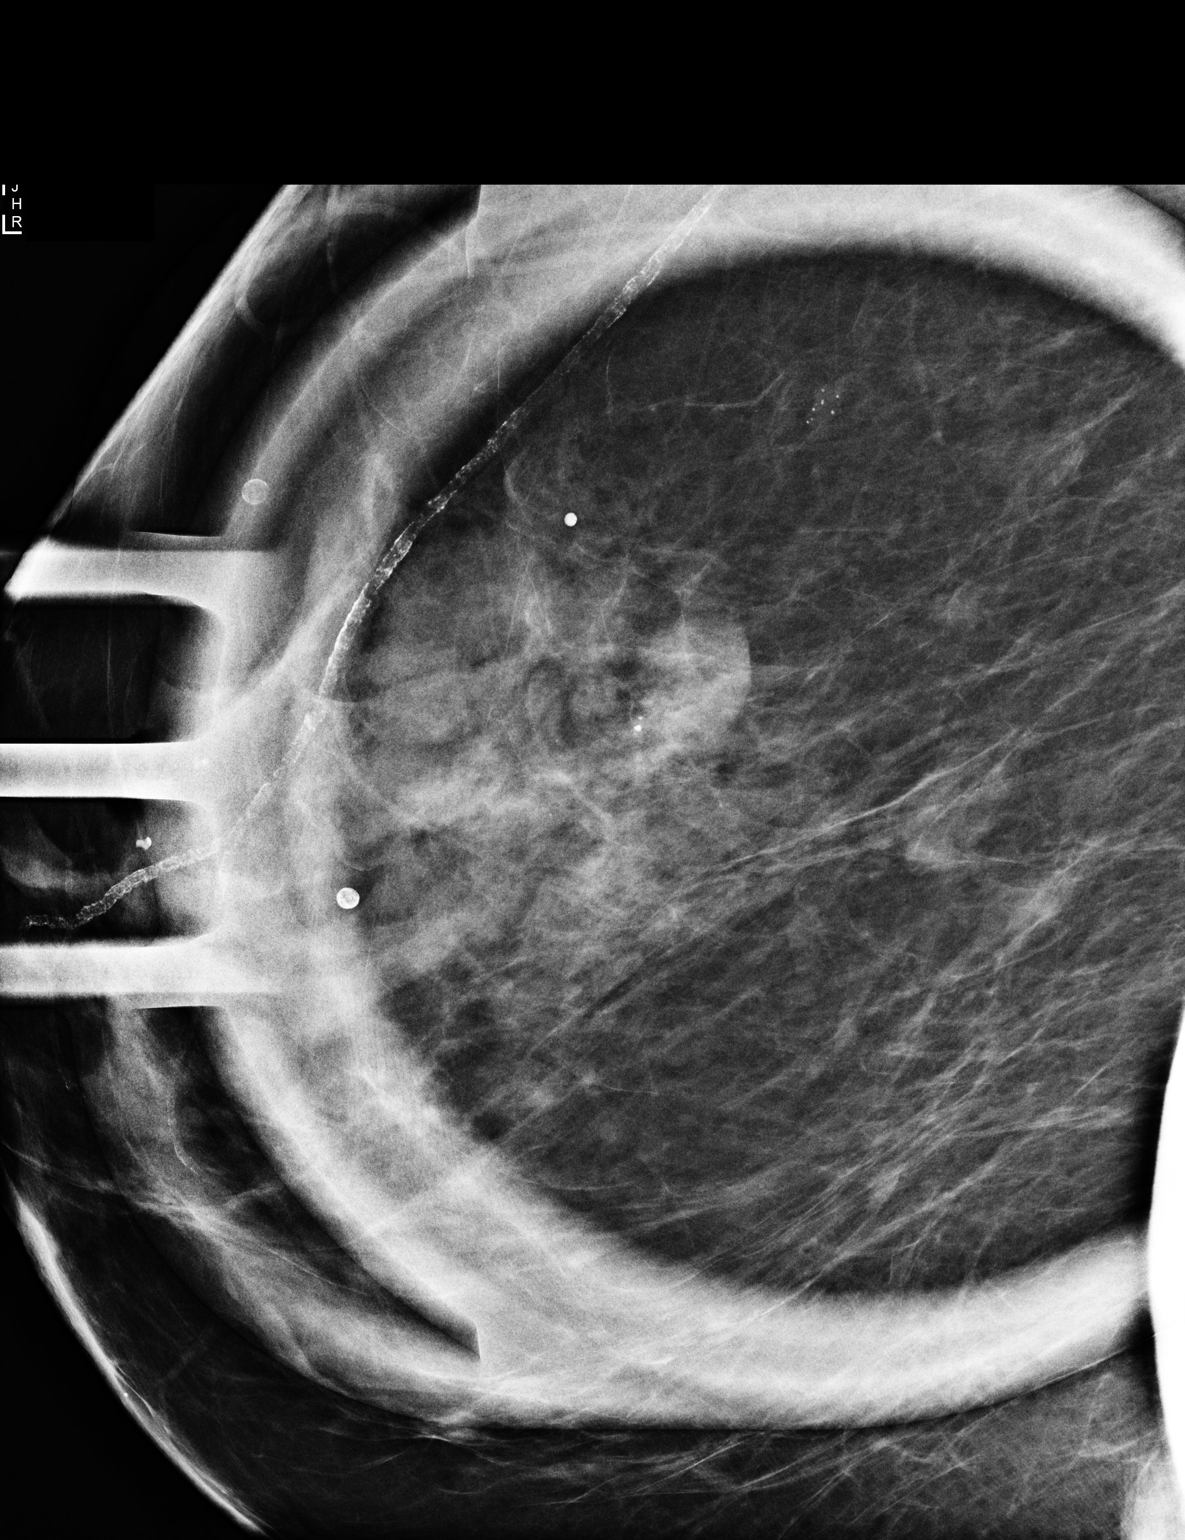

[R ML (2 of 2)]
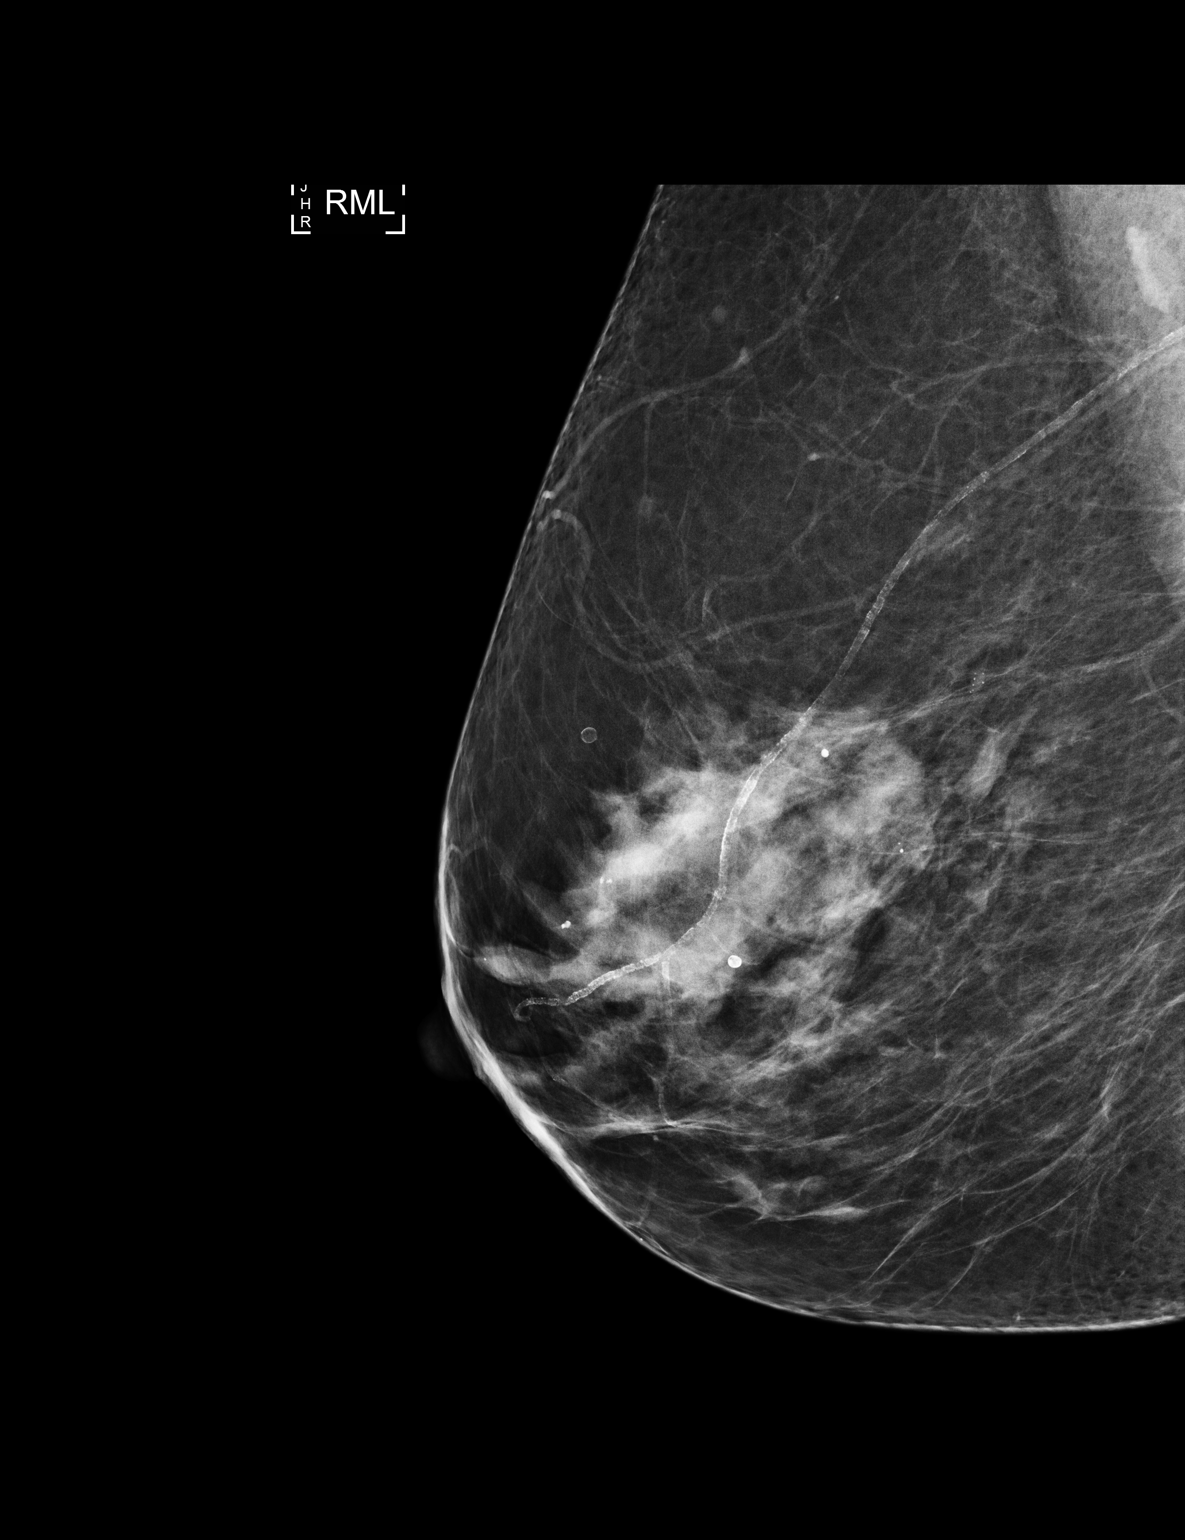

[3 of 3 positions shown; findings below may reference images not displayed]

ACR Breast Density Category c: The breast tissue is heterogeneously
dense, which may obscure small masses.
FINDINGS: True lateral and spot magnification views of the right breast were
obtained. These demonstrate a 4 x 2 x 2 mm group of punctate,
rounded and oval calcifications with a small amount of associated
mildly irregular soft tissue density in the upper inner quadrant of
the right breast.

Mammographic images were processed with CAD.
IMPRESSION: 4 mm group of indeterminate calcifications in the upper inner
quadrant of the right breast.

RECOMMENDATION:
Stereotactic guided core needle biopsy of the 4 mm group of
calcifications in the upper inner quadrant of the right breast. This
has been discussed with the patient and scheduled at [DATE] a.m. on
03/13/2017.

I have discussed the findings and recommendations with the patient.
Results were also provided in writing at the conclusion of the
visit. If applicable, a reminder letter will be sent to the patient
regarding the next appointment.

BI-RADS CATEGORY  4: Suspicious.

## 2018-06-13 IMAGING — MG STEREOTACTIC VACUUM ASSIST RIGHT
1 series · 1 of 1 positions shown · non-contrast
Comparison: Previous exams.

ADDENDUM:
Pathology revealed HYALINIZED FIBROADENOMA WITH CALCIFICATIONS of
the Right breast, upper inner quadrant. This was found to be
concordant by Dr. Bissam Bendris. Pathology results were discussed
with the patient by telephone. The patient reported doing well after
the biopsy with tenderness at the site. Post biopsy instructions and
care were reviewed and questions were answered. The patient was
encouraged to call The [REDACTED] for any
additional concerns. The patient was instructed to return for annual
screening mammography and informed a reminder notice would be sent
regarding this appointment.

Pathology results reported by Tengah Alcera, RN on 03/14/2017.
CLINICAL DATA: The patient presents for stereotactic guided core
biopsy of calcifications in the upper inner quadrant of the right
breast.
EXAM:
RIGHT BREAST STEREOTACTIC CORE NEEDLE BIOPSY

[R]
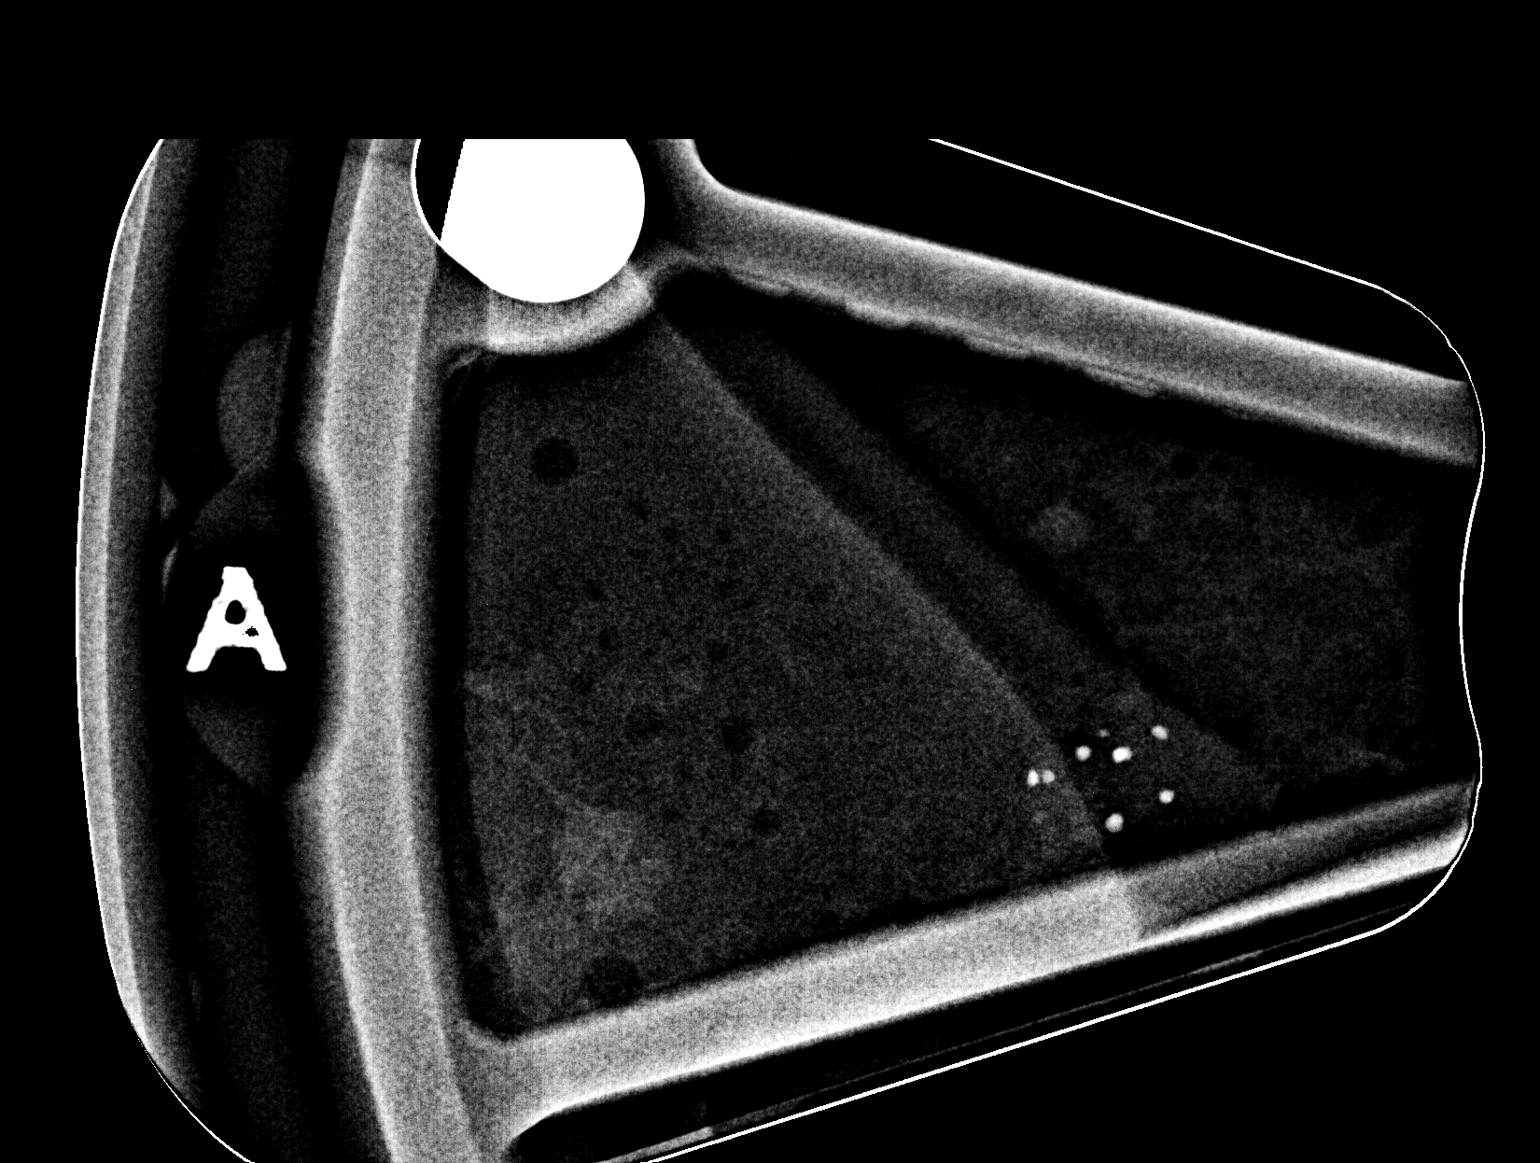

[1 of 1 positions shown; findings below may reference images not displayed]



Using sterile technique and 1% Lidocaine as local anesthetic, under
stereotactic guidance, a 9 gauge vacuum assisted device was used to
perform core needle biopsy of calcifications in the upper inner
quadrant of the right breast using a superior to inferior approach.
Specimen radiograph was performed showing calcifications to be
present. Specimens with calcifications are identified for pathology.

Lesion quadrant: Upper inner quadrant right breast

At the conclusion of the procedure, a coil shaped tissue marker clip
was deployed into the biopsy cavity. Follow-up 2-view mammogram was
performed and dictated separately.
IMPRESSION: Stereotactic-guided biopsy of right breast calcifications. No
apparent complications.

## 2018-06-13 IMAGING — MG MM CLIP PLACEMENT
2 series · 2 of 2 positions shown · non-contrast
Comparison: Previous exam(s).

CLINICAL DATA: Status post stereotactic guided core biopsy of
calcifications in the upper inner quadrant of the right breast.

EXAM:
DIAGNOSTIC RIGHT MAMMOGRAM POST STEREOTACTIC BIOPSY

[R ML]
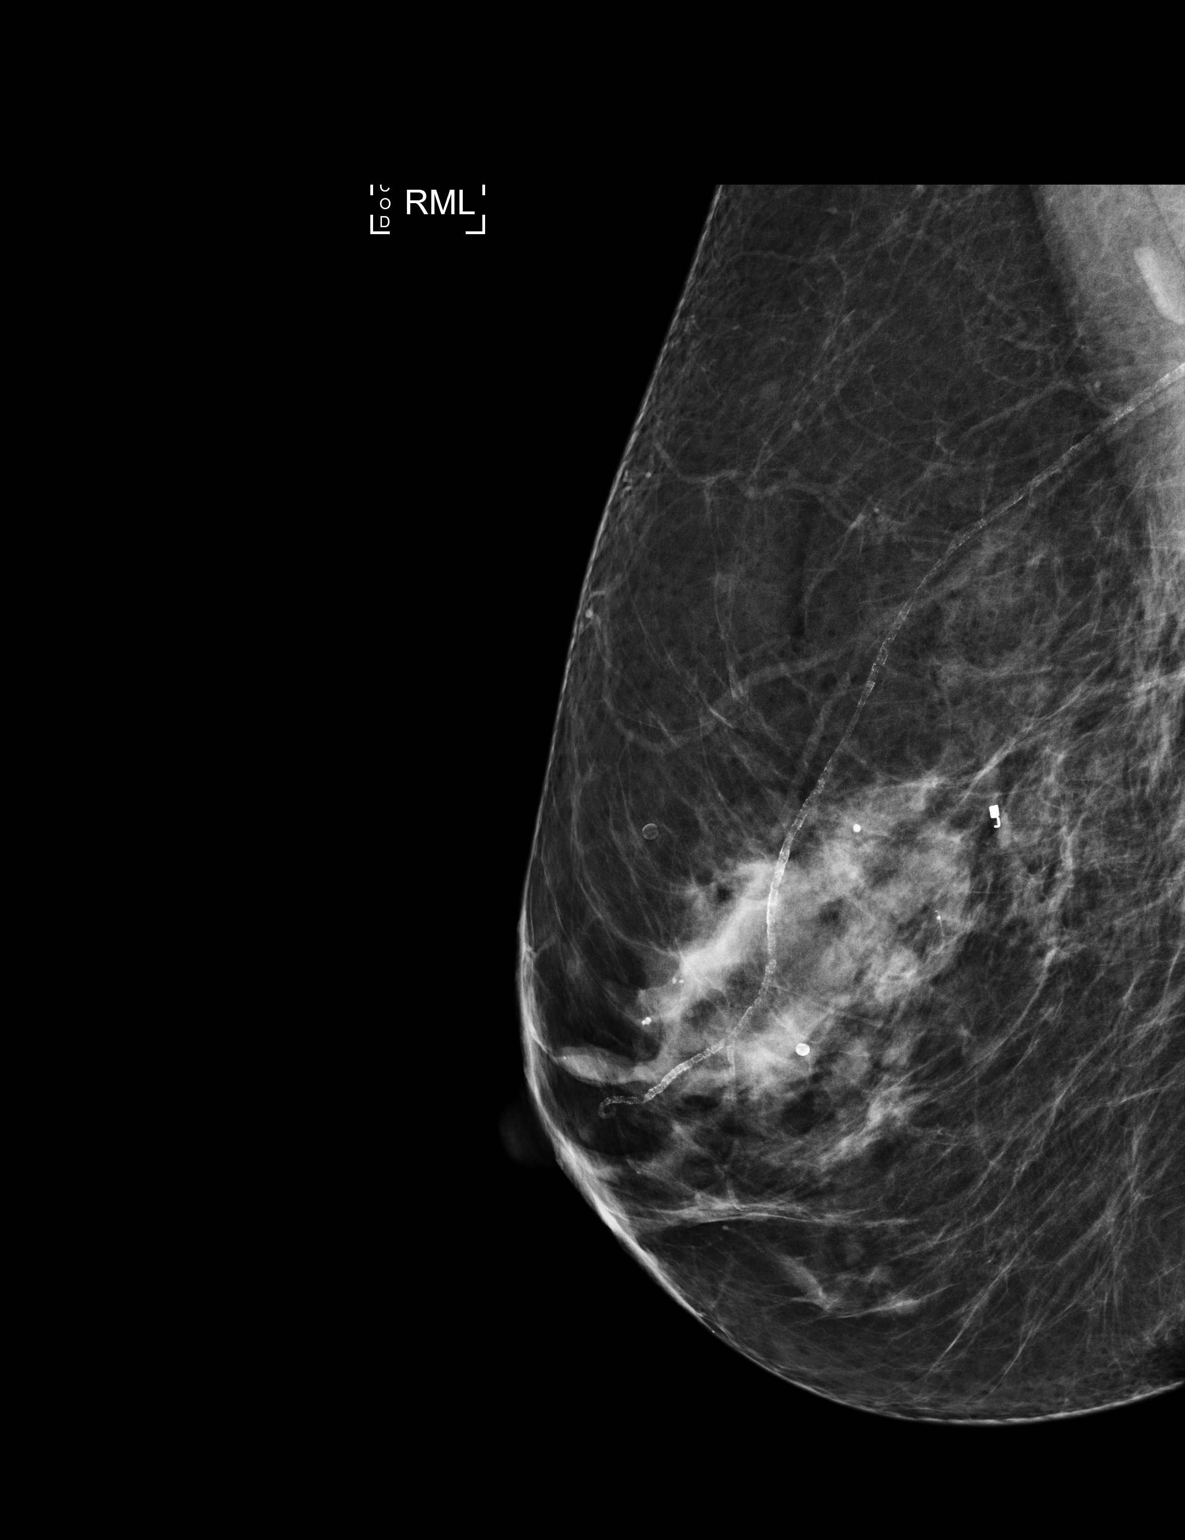

[R CC]
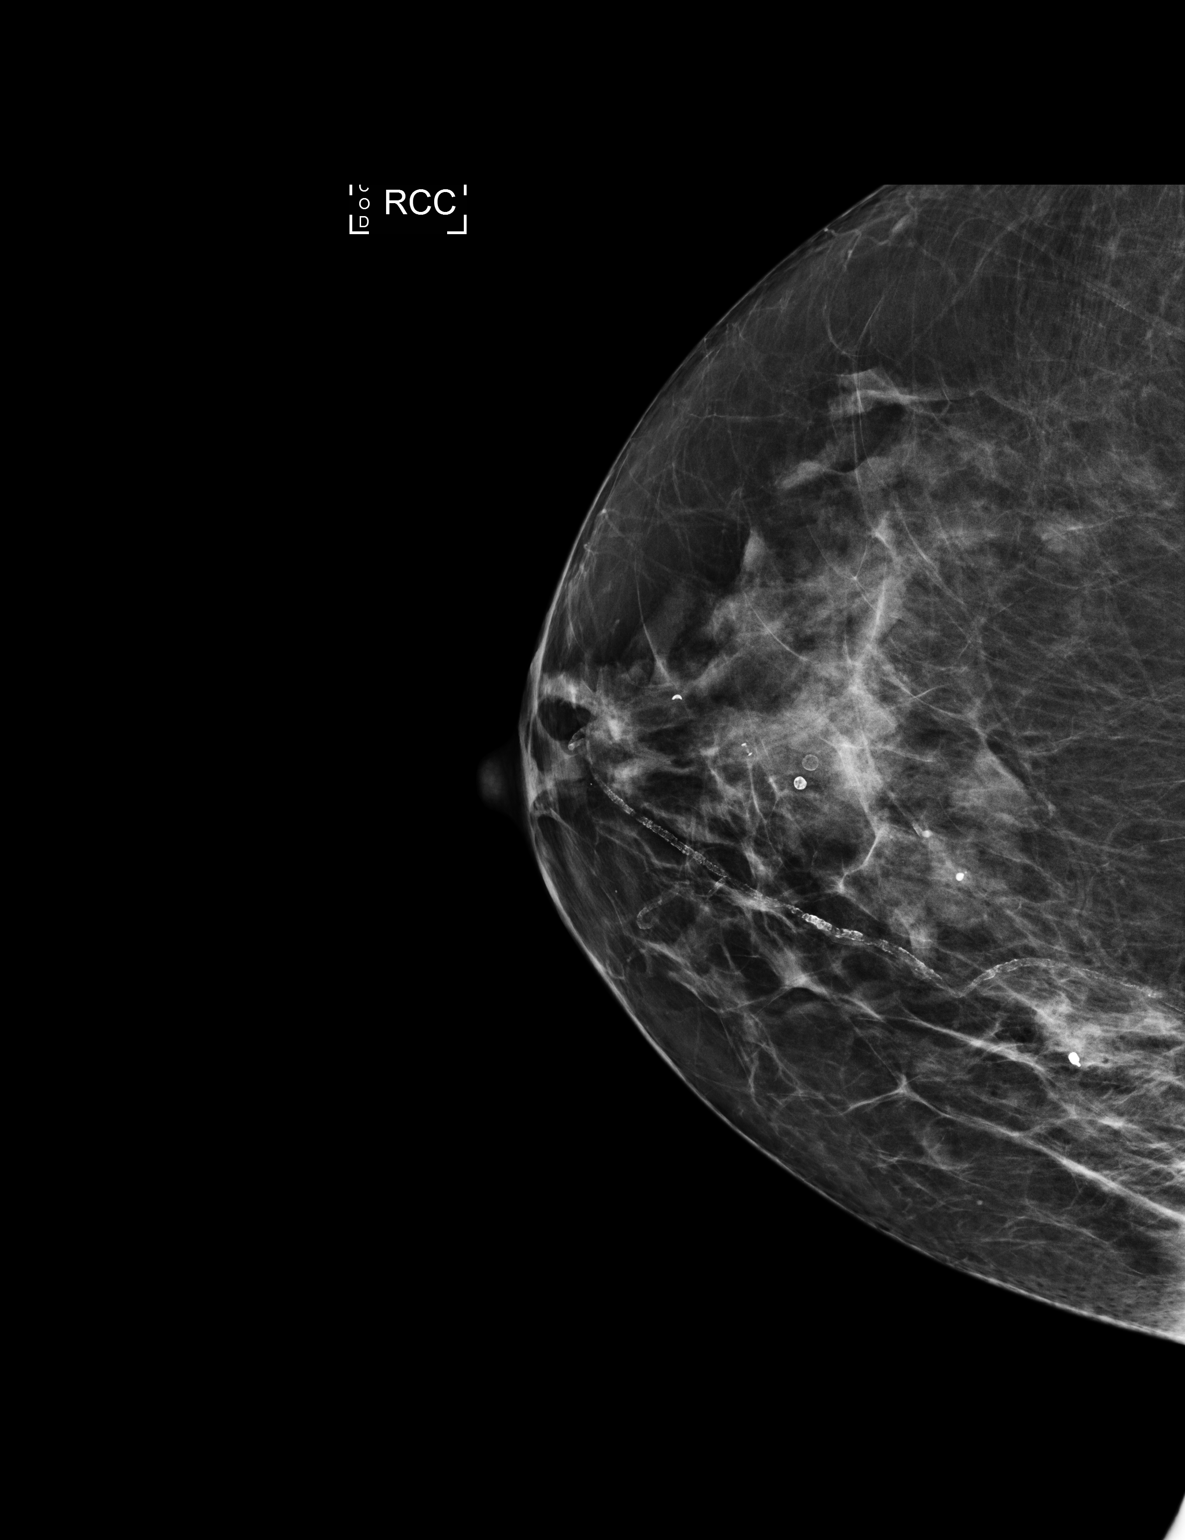

[2 of 2 positions shown; findings below may reference images not displayed]

FINDINGS: Mammographic images were obtained following stereotactic guided
biopsy of calcifications in the upper inner quadrant of the right
breast. A coil shaped clip is identified in the upper inner quadrant
of the right breast, in expected location after biopsy have.
IMPRESSION: Tissue marker clip in the expected location following biopsy.

Final Assessment: Post Procedure Mammograms for Marker Placement

## 2018-08-06 DIAGNOSIS — R69 Illness, unspecified: Secondary | ICD-10-CM | POA: Diagnosis not present

## 2018-12-22 ENCOUNTER — Encounter: Payer: Medicare HMO | Admitting: Physician Assistant

## 2018-12-23 DIAGNOSIS — H2513 Age-related nuclear cataract, bilateral: Secondary | ICD-10-CM | POA: Diagnosis not present

## 2018-12-23 DIAGNOSIS — H524 Presbyopia: Secondary | ICD-10-CM | POA: Diagnosis not present

## 2019-01-08 ENCOUNTER — Ambulatory Visit (INDEPENDENT_AMBULATORY_CARE_PROVIDER_SITE_OTHER): Payer: Medicare HMO | Admitting: Physician Assistant

## 2019-01-08 ENCOUNTER — Encounter: Payer: Self-pay | Admitting: Physician Assistant

## 2019-01-08 ENCOUNTER — Other Ambulatory Visit: Payer: Self-pay

## 2019-01-08 VITALS — BP 119/50 | HR 62 | Temp 98.3°F | Ht 63.5 in | Wt 127.0 lb

## 2019-01-08 DIAGNOSIS — Z131 Encounter for screening for diabetes mellitus: Secondary | ICD-10-CM | POA: Diagnosis not present

## 2019-01-08 DIAGNOSIS — Z13220 Encounter for screening for lipoid disorders: Secondary | ICD-10-CM

## 2019-01-08 DIAGNOSIS — Z0000 Encounter for general adult medical examination without abnormal findings: Secondary | ICD-10-CM | POA: Diagnosis not present

## 2019-01-08 DIAGNOSIS — E7800 Pure hypercholesterolemia, unspecified: Secondary | ICD-10-CM | POA: Diagnosis not present

## 2019-01-08 DIAGNOSIS — M19049 Primary osteoarthritis, unspecified hand: Secondary | ICD-10-CM | POA: Diagnosis not present

## 2019-01-08 DIAGNOSIS — M810 Age-related osteoporosis without current pathological fracture: Secondary | ICD-10-CM

## 2019-01-08 MED ORDER — DICLOFENAC SODIUM 1 % TD GEL: 4.0000 g | Freq: Four times a day (QID) | TRANSDERMAL | 1 refills | Status: DC

## 2019-01-08 MED ORDER — ALENDRONATE SODIUM 70 MG PO TABS: 70.0000 mg | ORAL_TABLET | ORAL | 3 refills | Status: DC

## 2019-01-08 MED ORDER — CELECOXIB 50 MG PO CAPS: 50.0000 mg | ORAL_CAPSULE | Freq: Two times a day (BID) | ORAL | 5 refills | Status: DC

## 2019-01-08 NOTE — Patient Instructions (Addendum)

## 2019-01-08 NOTE — Progress Notes (Signed)
Subjective:     Connie Burke is a 73 y.o. female and is here for a comprehensive physical exam. The patient reports problems - she is having some hand pain. right greater than left. she is not taking anything for it. she has tried tumeric and seemed to make it worse. worse after using hands with crochet.   Social History   Socioeconomic History  . Marital status: Single    Spouse name: Not on file  . Number of children: Not on file  . Years of education: Not on file  . Highest education level: Not on file  Occupational History  . Occupation: Engineer, materials  Social Needs  . Financial resource strain: Not on file  . Food insecurity    Worry: Not on file    Inability: Not on file  . Transportation needs    Medical: Not on file    Non-medical: Not on file  Tobacco Use  . Smoking status: Never Smoker  . Smokeless tobacco: Never Used  Substance and Sexual Activity  . Alcohol use: Yes    Alcohol/week: 0.0 standard drinks    Comment: wine daily  . Drug use: No  . Sexual activity: Never  Lifestyle  . Physical activity    Days per week: Not on file    Minutes per session: Not on file  . Stress: Not on file  Relationships  . Social Herbalist on phone: Not on file    Gets together: Not on file    Attends religious service: Not on file    Active member of club or organization: Not on file    Attends meetings of clubs or organizations: Not on file    Relationship status: Not on file  . Intimate partner violence    Fear of current or ex partner: Not on file    Emotionally abused: Not on file    Physically abused: Not on file    Forced sexual activity: Not on file  Other Topics Concern  . Not on file  Social History Narrative  . Not on file   Health Maintenance  Topic Date Due  . INFLUENZA VACCINE  01/02/2019  . TETANUS/TDAP  01/09/2020 (Originally 03/16/1965)  . PNA vac Low Risk Adult (1 of 2 - PCV13) 10/30/2021 (Originally 03/15/2013)  . MAMMOGRAM   04/18/2019  . COLONOSCOPY  07/05/2023  . DEXA SCAN  Completed  . Hepatitis C Screening  Completed    The following portions of the patient's history were reviewed and updated as appropriate: allergies, current medications, past family history, past medical history, past social history, past surgical history and problem list.  Review of Systems Pertinent items noted in HPI and remainder of comprehensive ROS otherwise negative.   Objective:    BP (!) 119/50   Pulse 62   Temp 98.3 F (36.8 C) (Oral)   Ht 5' 3.5" (1.613 m)   Wt 127 lb (57.6 kg)   SpO2 99%   BMI 22.14 kg/m  General appearance: alert, cooperative and appears stated age Head: Normocephalic, without obvious abnormality, atraumatic Eyes: conjunctivae/corneas clear. PERRL, EOM's intact. Fundi benign. Ears: normal TM's and external ear canals both ears Nose: Nares normal. Septum midline. Mucosa normal. No drainage or sinus tenderness. Throat: lips, mucosa, and tongue normal; teeth and gums normal Neck: no adenopathy, no carotid bruit, no JVD, supple, symmetrical, trachea midline and thyroid not enlarged, symmetric, no tenderness/mass/nodules Back: symmetric, no curvature. ROM normal. No CVA tenderness. Lungs: clear to  auscultation bilaterally Breasts: normal appearance, no masses or tenderness Heart: regular rate and rhythm, S1, S2 normal, no murmur, click, rub or gallop Abdomen: soft, non-tender; bowel sounds normal; no masses,  no organomegaly Extremities: extremities normal, atraumatic, no cyanosis or edema bilateral hand with PIP nodules. No swelling or erythema.  Pulses: 2+ and symmetric Skin: Skin color, texture, turgor normal. No rashes or lesions Lymph nodes: Cervical, supraclavicular, and axillary nodes normal. Neurologic: Alert and oriented X 3, normal strength and tone. Normal symmetric reflexes. Normal coordination and gait    Assessment:    Healthy female exam.      Plan:    Marland KitchenMarland KitchenRocquel was seen today  for annual exam.  Diagnoses and all orders for this visit:  Routine physical examination -     Lipid Panel w/reflex Direct LDL -     COMPLETE METABOLIC PANEL WITH GFR  Age-related osteoporosis without current pathological fracture -     alendronate (FOSAMAX) 70 MG tablet; Take 1 tablet (70 mg total) by mouth once a week. Take with a full glass of water on an empty stomach.  Screening for lipid disorders -     Lipid Panel w/reflex Direct LDL  Screening for diabetes mellitus -     COMPLETE METABOLIC PANEL WITH GFR  Hand arthritis -     celecoxib (CELEBREX) 50 MG capsule; Take 1 capsule (50 mg total) by mouth 2 (two) times daily. -     diclofenac sodium (VOLTAREN) 1 % GEL; Apply 4 g topically 4 (four) times daily. To affected joint.   .. Depression screen [facility] 2/9 01/08/2019 12/26/2016 10/30/2016 10/30/2016  Decreased Interest 0 3 0 0  Down, Depressed, Hopeless 0 0 0 0  PHQ - 2 Score 0 3 0 0  Altered sleeping 0 0 - -  Tired, decreased energy 0 0 - -  Change in appetite 0 0 - -  Feeling bad or failure about yourself  0 0 - -  Trouble concentrating 0 0 - -  Moving slowly or fidgety/restless 0 0 - -  Suicidal thoughts 0 0 - -  PHQ-9 Score 0 3 - -  Difficult doing work/chores Not difficult at all - - -   .Marland Kitchen Discussed 150 minutes of exercise a week.  Encouraged vitamin D 1000 units and Calcium 1300mg  or 4 servings of dairy a day.  Fasting labs ordered.  Pt declined pneumonia/Tdap. Mammogram up to date.  Colonoscopy up to date.   Pt has osteoporosis but not being treated. Discussed risk. Pt agreed to try fosamax. Recheck bone density in November. On vitamin D and calcium. Encouraged exercise and low weight training.   Discussed hand arthritis. Try voltaren gel. No kidney problems. Could also try celebrex. Follow up as needed. If particular joint giving problem sports medicine could do a joint injection.   See After Visit Summary for Counseling Recommendations

## 2019-01-09 LAB — COMPLETE METABOLIC PANEL WITH GFR
AG Ratio: 1.9 (calc) (ref 1.0–2.5)
ALT: 14 U/L (ref 6–29)
AST: 13 U/L (ref 10–35)
Albumin: 4.4 g/dL (ref 3.6–5.1)
Alkaline phosphatase (APISO): 71 U/L (ref 37–153)
BUN: 13 mg/dL (ref 7–25)
CO2: 27 mmol/L (ref 20–32)
Calcium: 9.7 mg/dL (ref 8.6–10.4)
Chloride: 105 mmol/L (ref 98–110)
Creat: 0.67 mg/dL (ref 0.60–0.93)
GFR, Est African American: 103 mL/min/{1.73_m2} (ref >=60)
GFR, Est Non African American: 89 mL/min/{1.73_m2} (ref >=60)
Globulin: 2.3 g/dL (calc) (ref 1.9–3.7)
Glucose, Bld: 95 mg/dL (ref 65–99)
Potassium: 4.1 mmol/L (ref 3.5–5.3)
Sodium: 140 mmol/L (ref 135–146)
Total Bilirubin: 0.5 mg/dL (ref 0.2–1.2)
Total Protein: 6.7 g/dL (ref 6.1–8.1)

## 2019-01-09 LAB — LIPID PANEL W/REFLEX DIRECT LDL
Cholesterol: 225 mg/dL — ABNORMAL HIGH (ref <200)
HDL: 68 mg/dL (ref >=50)
LDL Cholesterol (Calc): 133 mg/dL (calc) — ABNORMAL HIGH
Non-HDL Cholesterol (Calc): 157 mg/dL (calc) — ABNORMAL HIGH (ref <130)
Total CHOL/HDL Ratio: 3.3 (calc) (ref <5.0)
Triglycerides: 125 mg/dL (ref <150)

## 2019-01-11 NOTE — Progress Notes (Signed)
Call pt: cholesterol pretty stable from last year. 10 year risk is still 8.9 percent the AHA  recommend starting a statin a cholesterol lowering drug to reduce CV risk with risk above 7.5 percent.thoughts? Are you ok with this?  Glucose, liver, kidneys all look good.

## 2019-03-09 ENCOUNTER — Telehealth: Payer: Self-pay | Admitting: Physician Assistant

## 2019-03-09 DIAGNOSIS — Z13820 Encounter for screening for osteoporosis: Secondary | ICD-10-CM

## 2019-03-09 DIAGNOSIS — Z1231 Encounter for screening mammogram for malignant neoplasm of breast: Secondary | ICD-10-CM

## 2019-03-09 NOTE — Telephone Encounter (Signed)
Pt called. She needs bone density test and mammogram(she wants to do both on the same day).  Thank you.

## 2019-03-09 NOTE — Telephone Encounter (Signed)
Placed order. When they call make sure schedule for same day.

## 2019-03-11 DIAGNOSIS — L720 Epidermal cyst: Secondary | ICD-10-CM | POA: Diagnosis not present

## 2019-03-11 DIAGNOSIS — Z08 Encounter for follow-up examination after completed treatment for malignant neoplasm: Secondary | ICD-10-CM | POA: Diagnosis not present

## 2019-03-11 DIAGNOSIS — Z85828 Personal history of other malignant neoplasm of skin: Secondary | ICD-10-CM | POA: Diagnosis not present

## 2019-03-11 DIAGNOSIS — L820 Inflamed seborrheic keratosis: Secondary | ICD-10-CM | POA: Diagnosis not present

## 2019-03-11 DIAGNOSIS — L570 Actinic keratosis: Secondary | ICD-10-CM | POA: Diagnosis not present

## 2019-03-11 NOTE — Telephone Encounter (Signed)
Pt advised.

## 2019-04-21 ENCOUNTER — Other Ambulatory Visit: Payer: Self-pay

## 2019-04-21 ENCOUNTER — Ambulatory Visit (INDEPENDENT_AMBULATORY_CARE_PROVIDER_SITE_OTHER): Payer: Medicare HMO

## 2019-04-21 DIAGNOSIS — Z13820 Encounter for screening for osteoporosis: Secondary | ICD-10-CM | POA: Diagnosis not present

## 2019-04-21 DIAGNOSIS — Z1231 Encounter for screening mammogram for malignant neoplasm of breast: Secondary | ICD-10-CM

## 2019-04-21 DIAGNOSIS — M810 Age-related osteoporosis without current pathological fracture: Secondary | ICD-10-CM | POA: Diagnosis not present

## 2019-04-21 NOTE — Telephone Encounter (Signed)
Osteoporosis found on bone density. There are medications to help combat this. Schedule a virtual to discuss options.

## 2019-04-21 NOTE — Telephone Encounter (Signed)
Normal mammogram. Follow up in 1 year.

## 2019-04-23 ENCOUNTER — Ambulatory Visit (INDEPENDENT_AMBULATORY_CARE_PROVIDER_SITE_OTHER): Payer: Medicare HMO | Admitting: Physician Assistant

## 2019-04-23 ENCOUNTER — Encounter: Payer: Self-pay | Admitting: Physician Assistant

## 2019-04-23 VITALS — BP 120/60 | HR 70 | Ht 63.5 in

## 2019-04-23 DIAGNOSIS — M810 Age-related osteoporosis without current pathological fracture: Secondary | ICD-10-CM | POA: Diagnosis not present

## 2019-04-23 DIAGNOSIS — N8110 Cystocele, unspecified: Secondary | ICD-10-CM | POA: Diagnosis not present

## 2019-04-23 NOTE — Progress Notes (Signed)
Patient ID: Connie Burke, female   DOB: 02-19-1946, 73 y.o.   MRN: [id number] .Marland KitchenVirtual Visit via Video Note  I connected with Connie Burke on 04/26/19 at  2:00 PM EST by a video enabled telemedicine application and verified that I am speaking with the correct person using two identifiers.  Location: Patient: home Provider: clinic   I discussed the limitations of evaluation and management by telemedicine and the availability of in person appointments. The patient expressed understanding and agreed to proceed.  History of Present Illness: Patient is a 73 year old female with osteoporosis who calls in to the clinic to discuss bone density treatment.  2 years ago patient's T score was -2.7.  There is only very minimal decrease in bone density.  Patient was educated on treatment 2 years ago and Fosamax was sent to the pharmacy.  She never started the Fosamax due to the side effects.  She would like to talk about the difference between treatment plans.  She denies any recent fractures or falls.  Patient does have a cystocele for many years.  She was seen back in 2016 by Dr. Hulan Fray here in this building GYN and they tried a pessary at that time.  That did not work well.  She has been doing Dealer.  Her cystocele seems to be getting worse and she would like to talk to a Psychologist, sport and exercise. Pt denies any pain but more pressure and discomfort.   .. Active Ambulatory Problems    Diagnosis Date Noted  . Cystocele 08/30/2014  . Bunion, left foot 09/02/2014  . Osteoporosis 09/15/2014  . 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 Precancerous skin lesion 08/14/2015  . Tendonitis, Achilles, right 07/08/2016  . Left foot pain 07/08/2016  . 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 Sesamoiditis 12/27/2016  . Breast calcification, right 03/05/2017  . Age spots 10/31/2017  . Hyperlipidemia 11/03/2017  . Dupuytren contracture 04/06/2018  . Onychomycosis 04/06/2018  . Female cystocele 04/23/2019    Resolved Ambulatory Problems    Diagnosis Date Noted  . Hypertriglyceridemia 11/02/2017   Past Medical History:  Diagnosis Date  . Osteopetrosis    Reviewed med, allergy, problem list.   Observations/Objective: No acute distress. Normal appearance and mood.   .. Today's Vitals   04/23/19 1410  BP: 120/60  Pulse: 70  Height: 5' 3.5" (1.613 m)   Body mass index is 22.14 kg/m.    Assessment and Plan: Marland KitchenMarland KitchenKoralie was seen today for follow-up.  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  Discussed side effects of medications.  Due to her acid reflux and other concerns we opted to start with Prolia.  Will get CMP for prolia shot. Continue vitamin D and eating at least 4 servings of calcium a day. Encouraged low weight bearing exercises. Follow up every 6 months. Recheck DEXA in 2 years.   Will refer to surgeon. Done kegals. Failed pessary. Becoming more problematic.    Follow Up Instructions:    I discussed the assessment and treatment plan with the patient. The patient was provided an opportunity to ask questions and all were answered. The patient agreed with the plan and demonstrated an understanding of the instructions.   The patient was advised to call back or seek an in-person evaluation if the symptoms worsen or if the  condition fails to improve as anticipated.    Iran Planas, PA-C

## 2019-04-23 NOTE — Patient Instructions (Signed)
Denosumab injection What is this medicine? DENOSUMAB (den oh sue mab) slows bone breakdown. Prolia is used to treat osteoporosis in women after menopause and in men, and in people who are taking corticosteroids for 6 months or more. Xgeva is used to treat a high calcium level due to cancer and to prevent bone fractures and other bone problems caused by multiple myeloma or cancer bone metastases. Xgeva is also used to treat giant cell tumor of the bone. This medicine may be used for other purposes; ask your health care provider or pharmacist if you have questions. COMMON BRAND NAME(S): Prolia, XGEVA What should I tell my health care provider before I take this medicine? They need to know if you have any of these conditions:  dental disease  having surgery or tooth extraction  infection  kidney disease  low levels of calcium or Vitamin D in the blood  malnutrition  on hemodialysis  skin conditions or sensitivity  thyroid or parathyroid disease  an unusual reaction to denosumab, other medicines, foods, dyes, or preservatives  pregnant or trying to get pregnant  breast-feeding How should I use this medicine? This medicine is for injection under the skin. It is given by a health care professional in a hospital or clinic setting. A special MedGuide will be given to you before each treatment. Be sure to read this information carefully each time. For Prolia, talk to your pediatrician regarding the use of this medicine in children. Special care may be needed. For Xgeva, talk to your pediatrician regarding the use of this medicine in children. While this drug may be prescribed for children as young as 13 years for selected conditions, precautions do apply. Overdosage: If you think you have taken too much of this medicine contact a poison control center or emergency room at once. NOTE: This medicine is only for you. Do not share this medicine with others. What if I miss a dose? It is  important not to miss your dose. Call your doctor or health care professional if you are unable to keep an appointment. What may interact with this medicine? Do not take this medicine with any of the following medications:  other medicines containing denosumab This medicine may also interact with the following medications:  medicines that lower your chance of fighting infection  steroid medicines like prednisone or cortisone This list may not describe all possible interactions. Give your health care provider a list of all the medicines, herbs, non-prescription drugs, or dietary supplements you use. Also tell them if you smoke, drink alcohol, or use illegal drugs. Some items may interact with your medicine. What should I watch for while using this medicine? Visit your doctor or health care professional for regular checks on your progress. Your doctor or health care professional may order blood tests and other tests to see how you are doing. Call your doctor or health care professional for advice if you get a fever, chills or sore throat, or other symptoms of a cold or flu. Do not treat yourself. This drug may decrease your body's ability to fight infection. Try to avoid being around people who are sick. You should make sure you get enough calcium and vitamin D while you are taking this medicine, unless your doctor tells you not to. Discuss the foods you eat and the vitamins you take with your health care professional. See your dentist regularly. Brush and floss your teeth as directed. Before you have any dental work done, tell your dentist you are   receiving this medicine. Do not become pregnant while taking this medicine or for 5 months after stopping it. Talk with your doctor or health care professional about your birth control options while taking this medicine. Women should inform their doctor if they wish to become pregnant or think they might be pregnant. There is a potential for serious side  effects to an unborn child. Talk to your health care professional or pharmacist for more information. What side effects may I notice from receiving this medicine? Side effects that you should report to your doctor or health care professional as soon as possible:  allergic reactions like skin rash, itching or hives, swelling of the face, lips, or tongue  bone pain  breathing problems  dizziness  jaw pain, especially after dental work  redness, blistering, peeling of the skin  signs and symptoms of infection like fever or chills; cough; sore throat; pain or trouble passing urine  signs of low calcium like fast heartbeat, muscle cramps or muscle pain; pain, tingling, numbness in the hands or feet; seizures  unusual bleeding or bruising  unusually weak or tired Side effects that usually do not require medical attention (report to your doctor or health care professional if they continue or are bothersome):  constipation  diarrhea  headache  joint pain  loss of appetite  muscle pain  runny nose  tiredness  upset stomach This list may not describe all possible side effects. Call your doctor for medical advice about side effects. You may report side effects to FDA at 1-800-FDA-1088. Where should I keep my medicine? This medicine is only given in a clinic, doctor's office, or other health care setting and will not be stored at home. NOTE: This sheet is a summary. It may not cover all possible information. If you have questions about this medicine, talk to your doctor, pharmacist, or health care provider.  2020 Elsevier/Gold Standard (2017-09-26 16:10:44)

## 2019-04-23 NOTE — Progress Notes (Signed)
Patient was stuck at work late and may be a few minutes late for her visit. FYI. I tried to call patient before and she was driving and not able to talk.   Patient is not at home. Patient wants a call back in 5 minutes.  Patient wants to discuss getting on Prolia. She wants to let you know about the side effects of Fosamax and if she could have any jaw pain with the Prolia. FYI.

## 2019-04-26 NOTE — Progress Notes (Signed)
Will work on this and made a staff message to myself.

## 2019-04-27 ENCOUNTER — Telehealth: Payer: Self-pay | Admitting: Physician Assistant

## 2019-04-27 NOTE — Telephone Encounter (Signed)
-----   Message from Tasia Catchings, [place] sent at 04/26/2019  9:38 AM EST ----- Per Luvenia Starch to work on [employer] for [employer].

## 2019-04-27 NOTE — Telephone Encounter (Signed)
Received a fax from insurance that Prolia does not need authorization. She will get labs next week. Labs have been placed. Form sent to scan.

## 2019-04-28 NOTE — Progress Notes (Signed)
Subjective:   Connie Burke is a 73 y.o. female who presents for Medicare Annual (Subsequent) preventive examination.  Review of Systems:  No ROS.  Medicare Wellness Virtual Visit.  Visual/audio telehealth visit, UTA vital signs.   See social history for additional risk factors.    Cardiac Risk Factors include: advanced age (>97men, >29 women) Sleep patterns: Getting 8 hours of sleep a night. Wakes up 1 time a night to void. Wakes up and feels rested and ready for the day.    Home Safety/Smoke Alarms: Feels safe in home. Smoke alarms in place.  Living environment; Lives alone with dog in a 2 story home and stairs have hand rails on them. Shower is a step over tub and no grab bars in place as of yet. Seat Belt Safety/Bike Helmet: Wears seat belt.   Female:   Pap- Aged out      Mammo- UTD      Dexa scan- UTD       CCS- UTD      Objective:     Vitals: There were no vitals taken for this visit.  There is no height or weight on file to calculate BMI.  Advanced Directives 05/03/2019 08/14/2015 08/14/2015  Does Patient Have a Medical Advance Directive? Yes Yes Yes  Type of Paramedic of [place];Living will - Living will  Does patient want to make changes to medical advance directive? No - Patient declined - -  Copy of [place] in Chart? No - copy requested - -    Tobacco Social History   Tobacco Use  Smoking Status Never Smoker  Smokeless Tobacco Never Used     Counseling given: Not Answered   Clinical Intake:  Pre-visit preparation completed: Yes  Pain : No/denies pain     Nutritional Risks: None Diabetes: No  How often do you need to have someone help you when you read instructions, pamphlets, or other written materials from your doctor or pharmacy?: 1 - Never What is the last grade level you completed in school?: 12  Interpreter Needed?: No  Information entered by :: Orlie Dakin, LPN  Past Medical  History:  Diagnosis Date  . Osteopetrosis    Past Surgical History:  Procedure Laterality Date  . TUBAL LIGATION     Family History  Problem Relation Age of Onset  . Hypertension Mother   . Arthritis Mother   . Cancer Maternal Aunt        breast   Social History   Socioeconomic History  . Marital status: Single    Spouse name: Not on file  . Number of children: 4  . Years of education: 68  . Highest education level: Some college, no degree  Occupational History  . Occupation: Engineer, materials  Social Needs  . Financial resource strain: Not hard at all  . Food insecurity    Worry: Never true    Inability: Never true  . Transportation needs    Medical: No    Non-medical: No  Tobacco Use  . Smoking status: Never Smoker  . Smokeless tobacco: Never Used  Substance and Sexual Activity  . Alcohol use: Not Currently    Alcohol/week: 0.0 standard drinks  . Drug use: No  . Sexual activity: Not Currently  Lifestyle  . Physical activity    Days per week: 7 days    Minutes per session: 50 min  . Stress: Not at all  Relationships  . Social connections  Talks on phone: More than three times a week    Gets together: Once a week    Attends religious service: More than 4 times per year    Active member of club or organization: Yes    Attends meetings of clubs or organizations: More than 4 times per year    Relationship status: Never married  Other Topics Concern  . Not on file  Social History Narrative   Walks dog daily everyday. Member of the prayer shawl club at her church.    Outpatient Encounter Medications as of 05/03/2019  Medication Sig  . celecoxib (CELEBREX) 50 MG capsule Take 1 capsule (50 mg total) by mouth 2 (two) times daily.  . Omega-3 Fatty Acids (FISH OIL) 1200 MG CAPS Take 1 capsule by mouth 2 (two) times a week.  . Vitamin D, Cholecalciferol, 10 MCG (400 UNIT) CAPS Take 2 tablets by mouth daily.   No facility-administered encounter medications on file  as of 05/03/2019.     Activities of Daily Living In your present state of health, do you have any difficulty performing the following activities: 05/03/2019  Hearing? N  Vision? N  Difficulty concentrating or making decisions? N  Walking or climbing stairs? N  Dressing or bathing? N  Doing errands, shopping? N  Preparing Food and eating ? N  Using the Toilet? N  In the past six months, have you accidently leaked urine? N  Do you have problems with loss of bowel control? N  Managing your Medications? N  Managing your Finances? N  Housekeeping or managing your Housekeeping? N  Some recent data might be hidden    Patient Care Team: Lavada Mesi as PCP - General (Family Medicine)    Assessment:   This is a routine wellness examination for Connie Burke.Physical assessment deferred to PCP.   Exercise Activities and Dietary recommendations Current Exercise Habits: Home exercise routine, Type of exercise: walking, Time (Minutes): 50, Frequency (Times/Week): 7, Weekly Exercise (Minutes/Week): 350, Intensity: Moderate, Exercise limited by: None identified Diet Eats vegetables, fruits and proteins in. Cooks a lot for herself. Breakfast:coffee and english muffin Lunch: tuna sandwich Dinner: Meat and vegetables Drinks water daily 16 ounces       Goals    . Exercise 3x per week (30 min per time)     Would like to do more strengthening exercises.       Fall Risk Fall Risk  05/03/2019 01/08/2019 10/30/2016  Falls in the past year? 0 1 No  Number falls in past yr: 0 0 -  Injury with Fall? 0 0 -  Risk for fall due to : - History of fall(s) -  Follow up Falls prevention discussed Education provided -   Is the patient's home free of loose throw rugs in walkways, pet beds, electrical cords, etc?   yes      Grab bars in the bathroom? no      Handrails on the stairs?   yes      Adequate lighting?   yes   Depression Screen PHQ 2/9 Scores 05/03/2019 01/08/2019 12/26/2016 10/30/2016   PHQ - 2 Score 0 0 3 0  PHQ- 9 Score - 0 3 -     Cognitive Function     6CIT Screen 05/03/2019 10/31/2017 10/30/2016  What Year? 0 points 0 points 0 points  What month? 0 points 0 points 0 points  What time? 0 points 0 points 0 points  Count back from 20 0 points 0 points  0 points  Months in reverse 0 points 0 points 0 points  Repeat phrase 0 points 2 points 4 points  Total Score 0 2 4     There is no immunization history on file for this patient.   Screening Tests Health Maintenance  Topic Date Due  . INFLUENZA VACCINE  01/02/2019  . TETANUS/TDAP  01/09/2020 (Originally 03/16/1965)  . PNA vac Low Risk Adult (1 of 2 - PCV13) 10/30/2021 (Originally 03/15/2013)  . MAMMOGRAM  04/20/2020  . COLONOSCOPY  07/05/2023  . DEXA SCAN  Completed  . Hepatitis C Screening  Completed       Plan:    Please schedule your next medicare wellness visit with me in 1 yr.  Connie Burke , Thank you for taking time to come for your Medicare Wellness Visit. I appreciate your ongoing commitment to your health goals. Please review the following plan we discussed and let me know if I can assist you in the future.   These are the goals we discussed: Goals    . Exercise 3x per week (30 min per time)     Would like to do more strengthening exercises.       This is a list of the screening recommended for you and due dates:  Health Maintenance  Topic Date Due  . Flu Shot  09/01/2019*  . Tetanus Vaccine  01/09/2020*  . Pneumonia vaccines (1 of 2 - PCV13) 10/30/2021*  . Mammogram  04/20/2020  . Colon Cancer Screening  07/05/2023  . DEXA scan (bone density measurement)  Completed  .  Hepatitis C: One time screening is recommended by Center for Disease Control  (CDC) for  adults born from [birth year] through 1963.   Completed  *Topic was postponed. The date shown is not the original due date.      These are the goals we discussed: Goals    . Exercise 3x per week (30 min per time)     Would like to do  more strengthening exercises.       This is a list of the screening recommended for you and due dates:  Health Maintenance  Topic Date Due  . Flu Shot  01/02/2019  . Tetanus Vaccine  01/09/2020*  . Pneumonia vaccines (1 of 2 - PCV13) 10/30/2021*  . Mammogram  04/20/2020  . Colon Cancer Screening  07/05/2023  . DEXA scan (bone density measurement)  Completed  .  Hepatitis C: One time screening is recommended by Center for Disease Control  (CDC) for  adults born from [birth year] through 1963.   Completed  *Topic was postponed. The date shown is not the original due date.      I have personally reviewed and noted the following in the patient's chart:   . Medical and social history . Use of alcohol, tobacco or illicit drugs  . Current medications and supplements . Functional ability and status . Nutritional status . Physical activity . Advanced directives . List of other physicians . Hospitalizations, surgeries, and ER visits in previous 12 months . Vitals . Screenings to include cognitive, depression, and falls . Referrals and appointments  In addition, I have reviewed and discussed with patient certain preventive protocols, quality metrics, and best practice recommendations. A written personalized care plan for preventive services as well as general preventive health recommendations were provided to patient.     Joanne Chars, LPN  [id number]

## 2019-05-03 ENCOUNTER — Ambulatory Visit (INDEPENDENT_AMBULATORY_CARE_PROVIDER_SITE_OTHER): Payer: Medicare HMO | Admitting: *Deleted

## 2019-05-03 VITALS — BP 120/60 | Ht 63.5 in | Wt 126.0 lb

## 2019-05-03 DIAGNOSIS — M810 Age-related osteoporosis without current pathological fracture: Secondary | ICD-10-CM | POA: Diagnosis not present

## 2019-05-03 DIAGNOSIS — M545 Low back pain: Secondary | ICD-10-CM | POA: Diagnosis not present

## 2019-05-03 DIAGNOSIS — M5416 Radiculopathy, lumbar region: Secondary | ICD-10-CM | POA: Diagnosis not present

## 2019-05-03 DIAGNOSIS — Z0000 Encounter for general adult medical examination without abnormal findings: Secondary | ICD-10-CM

## 2019-05-03 NOTE — Patient Instructions (Signed)
Please schedule your next medicare wellness visit with me in 1 yr.  Connie Burke , Thank you for taking time to come for your Medicare Wellness Visit. I appreciate your ongoing commitment to your health goals. Please review the following plan we discussed and let me know if I can assist you in the future.   These are the goals we discussed: Goals    . Exercise 3x per week (30 min per time)     Would like to do more strengthening exercises.

## 2019-05-04 LAB — COMPLETE METABOLIC PANEL WITHOUT GFR
AG Ratio: 1.7 (calc) (ref 1.0–2.5)
ALT: 14 U/L (ref 6–29)
AST: 13 U/L (ref 10–35)
Albumin: 4.3 g/dL (ref 3.6–5.1)
Alkaline phosphatase (APISO): 86 U/L (ref 37–153)
BUN/Creatinine Ratio: 37 (calc) — ABNORMAL HIGH (ref 6–22)
BUN: 22 mg/dL (ref 7–25)
CO2: 25 mmol/L (ref 20–32)
Calcium: 9.7 mg/dL (ref 8.6–10.4)
Chloride: 105 mmol/L (ref 98–110)
Creat: 0.59 mg/dL — ABNORMAL LOW (ref 0.60–0.93)
GFR, Est African American: 107 mL/min/1.73m2
GFR, Est Non African American: 92 mL/min/1.73m2
Globulin: 2.6 g/dL (ref 1.9–3.7)
Glucose, Bld: 95 mg/dL (ref 65–139)
Potassium: 4.2 mmol/L (ref 3.5–5.3)
Sodium: 140 mmol/L (ref 135–146)
Total Bilirubin: 0.3 mg/dL (ref 0.2–1.2)
Total Protein: 6.9 g/dL (ref 6.1–8.1)

## 2019-05-04 NOTE — Progress Notes (Signed)
Ok for prolia

## 2019-05-06 ENCOUNTER — Other Ambulatory Visit: Payer: Self-pay

## 2019-05-06 ENCOUNTER — Ambulatory Visit (INDEPENDENT_AMBULATORY_CARE_PROVIDER_SITE_OTHER): Payer: Medicare HMO | Admitting: Osteopathic Medicine

## 2019-05-06 VITALS — BP 119/70 | HR 73

## 2019-05-06 DIAGNOSIS — M810 Age-related osteoporosis without current pathological fracture: Secondary | ICD-10-CM

## 2019-05-06 MED ORDER — DENOSUMAB 60 MG/ML ~~LOC~~ SOSY
60.0000 mg | PREFILLED_SYRINGE | Freq: Once | SUBCUTANEOUS | Status: AC
  Administered 2019-05-06: 60 mg via SUBCUTANEOUS

## 2019-05-06 NOTE — Progress Notes (Signed)
prolia injection given. Pt tolerated well. Injection given in LD. She will RTC in 6 months for next injection.Maryruth Eve, Lahoma Crocker, CMA

## 2019-05-18 ENCOUNTER — Ambulatory Visit (INDEPENDENT_AMBULATORY_CARE_PROVIDER_SITE_OTHER): Payer: Medicare HMO | Admitting: Obstetrics and Gynecology

## 2019-05-18 ENCOUNTER — Other Ambulatory Visit: Payer: Self-pay

## 2019-05-18 ENCOUNTER — Encounter: Payer: Self-pay | Admitting: Obstetrics and Gynecology

## 2019-05-18 VITALS — BP 118/71 | HR 72 | Wt 126.0 lb

## 2019-05-18 DIAGNOSIS — N8110 Cystocele, unspecified: Secondary | ICD-10-CM | POA: Diagnosis not present

## 2019-05-18 NOTE — Progress Notes (Addendum)
Obstetrics and Gynecology New Patient Evaluation  Appointment Date: 05/18/2019  OBGYN Clinic: [facility]  Primary Care Provider: Donella Stade [facility])  Referring Provider: Donella Stade, PA-C  Chief Complaint: worsening prolapse  History of Present Illness: Connie Burke is a 73 y.o. Caucasian [id number] (LMP: mid 32s), seen for the above chief complaint. Her past medical history is significant for nothing.   Patient states that she was seen by a GYN several years ago back when she lived near [place] she was told that surgery wouldn't help and recommended PT, which she did but it was expensive so she stopped but still does exercises she learned from that.  She was seen by Dr. Hulan Fray here in [place] in 2016 who did a pessary trial in the office but it didn't seem to be the best option so she was told to continue with exercises.    She presents today b/c of worsening pelvic discomfort particularly at the end of the day.   She does note that when she does push at the suprapubic area it does help with getting out more urine but she doesn't feel that she is unable to empty her bladder fully.    No breast s/s, abdominal pain, vaginal itching, dyspareunia, increased urinary frequency, diarrhea, constipation, vaginal bleeding or spotting (since menopause), vaginal discharge. All other systems reviewed and are negative  Review of Systems:  as noted in the History of Present Illness.  Patient Active Problem List   Diagnosis Date Noted  . Female cystocele 04/23/2019  . Dupuytren contracture 04/06/2018  . Onychomycosis 04/06/2018  . Hyperlipidemia 11/03/2017  . Age spots 10/31/2017  . Breast calcification, right 03/05/2017  . 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 Sesamoiditis 12/27/2016  . Tendonitis, Achilles, right 07/08/2016  . Left foot pain 07/08/2016  . 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  Precancerous skin lesion 08/14/2015  . Osteoporosis 09/15/2014  . Bunion, left foot 09/02/2014  . Cystocele 08/30/2014    Past Medical History:  Past Medical History:  Diagnosis Date  . Osteopetrosis     Past Surgical History:  Past Surgical History:  Procedure Laterality Date  . TUBAL LIGATION      Past Obstetrical History:  OB History  Gravida Para Term Preterm AB Living  4 4 4     4   SAB TAB Ectopic Multiple Live Births               # Outcome Date GA Lbr Len/2nd Weight Sex Delivery Anes PTL Lv  4 Term           3 Term           2 Term           1 Term             Obstetric Comments  SVD x 4. Largest 7lbs    Past Gynecological History: As per HPI. History of Pap Smear(s): Yes.   Last pap 2013, which was negative. No history of abnormals History of HRT use: Oral Activella for 2-3 years that was started a few years after menopause Last sex 7 years ago  Social History:  Social History   Socioeconomic History  . Marital status: Single    Spouse name: Not on file  . Number of children: 4  . Years of education: 2  . Highest education level: Some college, no degree  Occupational History  . Occupation: Engineer, materials  Tobacco Use  . Smoking status: Never Smoker  . Smokeless tobacco: Never Used  Substance and Sexual Activity  . Alcohol use: Not Currently    Alcohol/week: 0.0 standard drinks  . Drug use: No  . Sexual activity: Not Currently  Other Topics Concern  . Not on file  Social History Narrative   Walks dog daily everyday. Member of the prayer shawl club at her church.   Social Determinants of Health   Financial Resource Strain: Low Risk   . Difficulty of Paying Living Expenses: Not hard at all  Food Insecurity: No Food Insecurity  . Worried About Charity fundraiser in the Last Year: Never true  . Ran Out of Food in the Last Year: Never true  Transportation Needs: No Transportation Needs  . Lack of Transportation (Medical): No  . Lack of  Transportation (Non-Medical): No  Physical Activity: Sufficiently Active  . Days of Exercise per Week: 7 days  . Minutes of Exercise per Session: 50 min  Stress: No Stress Concern Present  . Feeling of Stress : Not at all  Social Connections: Slightly Isolated  . Frequency of Communication with Friends and Family: More than three times a week  . Frequency of Social Gatherings with Friends and Family: Once a week  . Attends Religious Services: More than 4 times per year  . Active Member of Clubs or Organizations: Yes  . Attends Archivist Meetings: More than 4 times per year  . Marital Status: Never married  Intimate Partner Violence: Not At Risk  . Fear of Current or Ex-Partner: No  . Emotionally Abused: No  . Physically Abused: No  . Sexually Abused: No    Family History:  Family History  Problem Relation Age of Onset  . Hypertension Mother   . Arthritis Mother   . Cancer Maternal Aunt        breast    Medications [name] had no medications administered during this visit. Current Outpatient Medications  Medication Sig Dispense Refill  . celecoxib (CELEBREX) 50 MG capsule Take 1 capsule (50 mg total) by mouth 2 (two) times daily. 60 capsule 5  . Omega-3 Fatty Acids (FISH OIL) 1200 MG CAPS Take 1 capsule by mouth 2 (two) times a week.    . Vitamin D, Cholecalciferol, 10 MCG (400 UNIT) CAPS Take 2 tablets by mouth daily.     No current facility-administered medications for this visit.    Allergies Amoxicillin   Physical Exam:  BP 118/71   Pulse 72   Wt 126 lb (57.2 kg)   BMI 21.97 kg/m  Body mass index is 21.97 kg/m. General appearance: Well nourished, well developed female in no acute distress.  Respiratory:  Normal respiratory effort Abdomen: positive bowel sounds and no masses, hernias; diffusely non tender to palpation, non distended Neuro/Psych:  Normal mood and affect.  Skin:  Warm and dry.  Lymphatic:  No inguinal lymphadenopathy.    Pelvic exam: is not limited by body habitus With patient in dorsal lithotomy, prolapse noted to the level of the introitus EGBUS: normal with moderate atrophy Vagina:  Anterior vault prolapse, moderate atrophy, no blood or discharge Cervix: normal appearing cervix without tenderness, discharge or lesions. Uterus:  nonenlarged and non tender and Adnexa:  normal adnexa and no mass, fullness, tenderness Rectovaginal: deferred  POP-Q GH 5 PB 2 TVL 8 Aa +1 Ba 0 Ap -3 Bp -3 C 5 D -6  Laboratory: none  Radiology: none  Assessment: pt  stable with anterior vag vault prolapse  Plan:  1. Female cystocele I d/w her that I recommend a urogyn consult to talk about the options more in depth since she is not a straight forward case since she's done PT and first time pessary didn't work and she has a large prolapse with large genital hiatus.   Patient is amenable to plan to refer to [facility].   RTC PRN  Durene Romans MD Attending Center for [employer] [facility])

## 2019-05-19 ENCOUNTER — Encounter: Payer: Self-pay | Admitting: Obstetrics and Gynecology

## 2019-05-24 ENCOUNTER — Telehealth (INDEPENDENT_AMBULATORY_CARE_PROVIDER_SITE_OTHER): Payer: Medicare HMO

## 2019-05-24 DIAGNOSIS — N8110 Cystocele, unspecified: Secondary | ICD-10-CM

## 2019-05-24 DIAGNOSIS — Z8249 Family history of ischemic heart disease and other diseases of the circulatory system: Secondary | ICD-10-CM | POA: Diagnosis not present

## 2019-05-24 DIAGNOSIS — M810 Age-related osteoporosis without current pathological fracture: Secondary | ICD-10-CM | POA: Diagnosis not present

## 2019-05-24 DIAGNOSIS — M1990 Unspecified osteoarthritis, unspecified site: Secondary | ICD-10-CM | POA: Diagnosis not present

## 2019-05-24 DIAGNOSIS — Z79899 Other long term (current) drug therapy: Secondary | ICD-10-CM | POA: Diagnosis not present

## 2019-05-24 DIAGNOSIS — Z791 Long term (current) use of non-steroidal anti-inflammatories (NSAID): Secondary | ICD-10-CM | POA: Diagnosis not present

## 2019-05-24 DIAGNOSIS — R69 Illness, unspecified: Secondary | ICD-10-CM | POA: Diagnosis not present

## 2019-05-25 NOTE — Telephone Encounter (Signed)
[facility] with referral. Pt information provided. Appointment coordinator states their office will reach out to pt once they receive her information.  [facility] office notified to send pt demographics and provider note.    Called pt to notify her referral has been completed and she can expect a call to establish care. Office information given. Pt verbalizes understanding and has no concerns.

## 2019-06-22 DIAGNOSIS — N8189 Other female genital prolapse: Secondary | ICD-10-CM | POA: Diagnosis not present

## 2019-06-22 DIAGNOSIS — N812 Incomplete uterovaginal prolapse: Secondary | ICD-10-CM | POA: Diagnosis not present

## 2019-06-22 DIAGNOSIS — N8110 Cystocele, unspecified: Secondary | ICD-10-CM | POA: Diagnosis not present

## 2019-06-25 DIAGNOSIS — Z4689 Encounter for fitting and adjustment of other specified devices: Secondary | ICD-10-CM | POA: Diagnosis not present

## 2019-07-12 ENCOUNTER — Telehealth: Payer: Self-pay | Admitting: Neurology

## 2019-07-12 NOTE — Telephone Encounter (Signed)
Patient left vm stating she is having some hip discomfort. She had Prolia shot in November and she was wondering if this could be a side effect. Has never had this issue in the past.

## 2019-07-13 NOTE — Telephone Encounter (Signed)
Spoke with patient. She states pain in right hip started in December about a month after Prolia injection. Pain getting worse. Hurts daily, only when walking. No pain when sitting. No injuries. Has increased Calcium, no other changes in medication. Please advise.

## 2019-07-13 NOTE — Telephone Encounter (Signed)
Patient made aware and refused advice, she does not want hip evaluated. She will call back if needed.

## 2019-07-13 NOTE — Telephone Encounter (Signed)
I really do not think it is from the prolia shot. It should like arthritis in the hip. I would let Dr. Darene Lamer our sports medicine provider take a look at it and get some xrays.

## 2019-07-19 DIAGNOSIS — N812 Incomplete uterovaginal prolapse: Secondary | ICD-10-CM | POA: Diagnosis not present

## 2019-07-19 DIAGNOSIS — N8110 Cystocele, unspecified: Secondary | ICD-10-CM | POA: Diagnosis not present

## 2019-07-19 DIAGNOSIS — Z4689 Encounter for fitting and adjustment of other specified devices: Secondary | ICD-10-CM | POA: Diagnosis not present

## 2019-08-02 DIAGNOSIS — M545 Low back pain: Secondary | ICD-10-CM | POA: Diagnosis not present

## 2019-08-02 DIAGNOSIS — M5416 Radiculopathy, lumbar region: Secondary | ICD-10-CM | POA: Diagnosis not present

## 2019-08-03 DIAGNOSIS — M5416 Radiculopathy, lumbar region: Secondary | ICD-10-CM | POA: Diagnosis not present

## 2019-08-03 DIAGNOSIS — M545 Low back pain: Secondary | ICD-10-CM | POA: Diagnosis not present

## 2019-08-06 DIAGNOSIS — M545 Low back pain: Secondary | ICD-10-CM | POA: Diagnosis not present

## 2019-08-06 DIAGNOSIS — M5416 Radiculopathy, lumbar region: Secondary | ICD-10-CM | POA: Diagnosis not present

## 2019-08-09 ENCOUNTER — Other Ambulatory Visit: Payer: Self-pay

## 2019-08-09 ENCOUNTER — Ambulatory Visit: Payer: Medicare HMO | Admitting: Family Medicine

## 2019-08-09 ENCOUNTER — Ambulatory Visit: Payer: Self-pay

## 2019-08-09 DIAGNOSIS — M19049 Primary osteoarthritis, unspecified hand: Secondary | ICD-10-CM

## 2019-08-09 DIAGNOSIS — M25551 Pain in right hip: Secondary | ICD-10-CM

## 2019-08-09 DIAGNOSIS — M25561 Pain in right knee: Secondary | ICD-10-CM

## 2019-08-09 MED ORDER — CELECOXIB 100 MG PO CAPS: 100.0000 mg | ORAL_CAPSULE | Freq: Two times a day (BID) | ORAL | 6 refills | Status: DC | PRN

## 2019-08-09 NOTE — Progress Notes (Signed)
Office Visit Note   Patient: Connie Burke           Date of Birth: 03-28-1946           MRN: [id number] Visit Date: 08/09/2019 Requested by: Donella Stade, PA-C [place],  [place] [address] PCP: Lavada Mesi  Subjective: Chief Complaint  Patient presents with  . Right Knee - Pain  . Right Hip - Pain    First noticed pain 1 month after taking her first Prolia injection in November 2020. Pain has become more often and the knee/leg swells. Pain has moved into the hip, more noticeable in February 2021.    HPI: She is here with right leg pain.  In November she was given her first dose of Prolia for osteoporosis.  Soon thereafter she started having pain in her right posterior lateral hip with radiation to the knee and sometimes into the upper part of her lower leg.  The pain makes her walk with a limp.  She is using Celebrex and occasionally ibuprofen.  She thought it would get better with time but it has not.  No previous problems with her leg.              ROS:   All other systems were reviewed and are negative.  Objective: Vital Signs: There were no vitals taken for this visit.  Physical Exam:  General:  Alert and oriented, in no acute distress. Pulm:  Breathing unlabored. Psy:  Normal mood, congruent affect.  Right leg: Negative straight leg raise, no significant sciatic notch pain.  Lower extremity strength and reflexes are normal.  She has tenderness over the greater trochanter of both hips about equal.  She has pain with passive flexion and internal rotation of the right, although her range of motion is equal compared to the left.  No knee effusion, ligaments are stable, no significant joint line tenderness in the right knee.  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  No sign of stress fracture in the femur.    Assessment & Plan: 1.  Right hip and thigh pain about 3 to 4 months status  post Prolia injection, possible side effect from the medication as per potential side effects list. -We will try vitamin D3, vitamin K2 and magnesium.  Celebrex dosage increased.  Do this for the next 3 to 4 weeks and if symptoms do not improve, consider further imaging with MRI scan.     Procedures: No procedures performed  No notes on file     PMFS History: Patient Active Problem List   Diagnosis Date Noted  . Incomplete uterine prolapse 06/22/2019  . Weakness of perineal floor 06/22/2019  . Female cystocele 04/23/2019  . Dupuytren contracture 04/06/2018  . Onychomycosis 04/06/2018  . Hyperlipidemia 11/03/2017  . Age spots 10/31/2017  . Breast calcification, right 03/05/2017  . 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 Sesamoiditis 12/27/2016  . Tendonitis, Achilles, right 07/08/2016  . Left foot pain 07/08/2016  . 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 Precancerous skin lesion 08/14/2015  . Osteoporosis 09/15/2014  . Bunion, left foot 09/02/2014  . Cystocele 08/30/2014   Past Medical History:  Diagnosis Date  . Osteopetrosis     Family History  Problem Relation Age of Onset  . Hypertension Mother   . Arthritis Mother   . Cancer Maternal Aunt        breast  Past Surgical History:  Procedure Laterality Date  . TUBAL LIGATION     Social History   Occupational History  . 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 Smoking status: Never Smoker  . Smokeless tobacco: Never Used  Substance and Sexual Activity  . Alcohol use: Not Currently    Alcohol/week: 0.0 standard drinks  . Drug use: No  . Sexual activity: Not Currently

## 2019-08-09 NOTE — Patient Instructions (Signed)
   Vitamin D3:  5,000 IU daily  Vitamin K2:  100 mcg daily  Magnesium:  400 mg daily   

## 2019-08-17 ENCOUNTER — Other Ambulatory Visit: Payer: Self-pay | Admitting: Family Medicine

## 2019-08-17 DIAGNOSIS — M25551 Pain in right hip: Secondary | ICD-10-CM

## 2019-08-17 DIAGNOSIS — M25561 Pain in right knee: Secondary | ICD-10-CM

## 2019-08-17 NOTE — Progress Notes (Signed)
Leg pain is getting worse despite resting.  Will order MRI of femur to further evaluate.

## 2019-08-31 DIAGNOSIS — Z4689 Encounter for fitting and adjustment of other specified devices: Secondary | ICD-10-CM | POA: Diagnosis not present

## 2019-08-31 DIAGNOSIS — N812 Incomplete uterovaginal prolapse: Secondary | ICD-10-CM | POA: Diagnosis not present

## 2019-09-01 ENCOUNTER — Telehealth: Payer: Self-pay | Admitting: Family Medicine

## 2019-09-01 NOTE — Telephone Encounter (Signed)
Notified pt that insurance won't cover MRI until she's had a trial of conservative management.  She's feeling better at the moment and is getting ready to move to a new house, doesn't want to do PT right now.  She'll let me know if things change.

## 2020-01-25 DIAGNOSIS — L400 Psoriasis vulgaris: Secondary | ICD-10-CM | POA: Diagnosis not present

## 2020-01-25 DIAGNOSIS — L579 Skin changes due to chronic exposure to nonionizing radiation, unspecified: Secondary | ICD-10-CM | POA: Diagnosis not present

## 2020-01-25 DIAGNOSIS — L814 Other melanin hyperpigmentation: Secondary | ICD-10-CM | POA: Diagnosis not present

## 2020-01-25 DIAGNOSIS — L821 Other seborrheic keratosis: Secondary | ICD-10-CM | POA: Diagnosis not present

## 2020-03-07 DIAGNOSIS — L821 Other seborrheic keratosis: Secondary | ICD-10-CM | POA: Diagnosis not present

## 2020-03-07 DIAGNOSIS — B079 Viral wart, unspecified: Secondary | ICD-10-CM | POA: Diagnosis not present

## 2020-03-07 DIAGNOSIS — L400 Psoriasis vulgaris: Secondary | ICD-10-CM | POA: Diagnosis not present

## 2020-03-17 DIAGNOSIS — M1990 Unspecified osteoarthritis, unspecified site: Secondary | ICD-10-CM | POA: Diagnosis not present

## 2020-03-17 DIAGNOSIS — Z791 Long term (current) use of non-steroidal anti-inflammatories (NSAID): Secondary | ICD-10-CM | POA: Diagnosis not present

## 2020-03-22 ENCOUNTER — Other Ambulatory Visit: Payer: Self-pay | Admitting: Family Medicine

## 2020-03-22 DIAGNOSIS — Z1231 Encounter for screening mammogram for malignant neoplasm of breast: Secondary | ICD-10-CM

## 2020-03-29 DIAGNOSIS — N8110 Cystocele, unspecified: Secondary | ICD-10-CM | POA: Diagnosis not present

## 2020-03-29 DIAGNOSIS — N950 Postmenopausal bleeding: Secondary | ICD-10-CM | POA: Diagnosis not present

## 2020-03-29 DIAGNOSIS — N812 Incomplete uterovaginal prolapse: Secondary | ICD-10-CM | POA: Diagnosis not present

## 2020-03-31 ENCOUNTER — Other Ambulatory Visit: Payer: Self-pay | Admitting: Family Medicine

## 2020-03-31 DIAGNOSIS — M19049 Primary osteoarthritis, unspecified hand: Secondary | ICD-10-CM

## 2020-04-07 ENCOUNTER — Encounter: Payer: Self-pay | Admitting: Family Medicine

## 2020-04-07 ENCOUNTER — Ambulatory Visit (INDEPENDENT_AMBULATORY_CARE_PROVIDER_SITE_OTHER): Payer: Medicare HMO | Admitting: Family Medicine

## 2020-04-07 ENCOUNTER — Other Ambulatory Visit: Payer: Self-pay

## 2020-04-07 VITALS — BP 113/73 | HR 61 | Ht 63.0 in | Wt 125.8 lb

## 2020-04-07 DIAGNOSIS — E785 Hyperlipidemia, unspecified: Secondary | ICD-10-CM

## 2020-04-07 DIAGNOSIS — E559 Vitamin D deficiency, unspecified: Secondary | ICD-10-CM | POA: Diagnosis not present

## 2020-04-07 DIAGNOSIS — M810 Age-related osteoporosis without current pathological fracture: Secondary | ICD-10-CM

## 2020-04-07 DIAGNOSIS — N8110 Cystocele, unspecified: Secondary | ICD-10-CM

## 2020-04-07 DIAGNOSIS — Z0000 Encounter for general adult medical examination without abnormal findings: Secondary | ICD-10-CM | POA: Diagnosis not present

## 2020-04-07 NOTE — Progress Notes (Signed)
Office Visit Note   Patient: Connie Burke           Date of Birth: September 15, 1945           MRN: [id number] Visit Date: 04/07/2020 Requested by: Donella Stade, PA-C [facility] [place],  [place] [id number] PCP: Lavada Mesi  Subjective: Chief Complaint  Patient presents with  . Medicare Wellness  . cystocele - would like to discuss possble upcoming procedure    HPI: She is here for a wellness exam.  She is struggling with a chronic cystocele.  She is working with a TEFL teacher and is planning on having a procedure this winter.  She has a history of hyperlipidemia not requiring medication.  She has a history of osteoporosis and a history of vitamin D deficiency.  The last time she had a vitamin D level checked it was in normal range.  She saw a dermatologist recently.  She is up-to-date on colonoscopy.  She is due for an eye exam.  She is up-to-date on dental exams.  Family history is reviewed in detail.                 ROS:   No depression symptoms.  All other systems were reviewed and are negative.  Objective: Vital Signs: BP 113/73   Pulse 61   Ht 5\' 3"  (1.6 m)   Wt 125 lb 12.8 oz (57.1 kg)   BMI 22.28 kg/m   Physical Exam:  General:  Alert and oriented, in no acute distress. Pulm:  Breathing unlabored. Psy:  Normal mood, congruent affect. Skin: No suspicious lesions HEENT:  [place]/AT, PERRLA, EOM Full, no nystagmus.  Funduscopic examination within normal limits.  No conjunctival erythema.  Tympanic membranes are pearly gray with normal landmarks.  External ear canals are normal.  Nasal passages are clear.  Oropharynx is clear.  No significant lymphadenopathy.  No thyromegaly or nodules.  2+ carotid pulses without bruits. CV: Regular rate and rhythm without murmurs, rubs, or gallops.  No peripheral edema.  2+ radial and posterior tibial pulses. Lungs: Clear to auscultation throughout with no wheezing or areas of  consolidation. Abd: Bowel sounds are active, no hepatosplenomegaly or masses.  Soft and nontender.  No audible bruits.  No evidence of ascites.   Imaging: No results found.  Assessment & Plan: 1.  Wellness examination -Labs today. -Mammogram has been ordered. -Colonoscopy at 10 years after the last one.  2.  Cystocele -Proceed with repair as scheduled.  3.  Hyperlipidemia -Labs to evaluate  4.  Vitamin D deficiency with osteoporosis -Recheck vitamin D level today.      Procedures: No procedures performed  No notes on file     PMFS History: Patient Active Problem List   Diagnosis Date Noted  . Vitamin D deficiency 04/07/2020  . Incomplete uterine prolapse 06/22/2019  . Weakness of perineal floor 06/22/2019  . Female cystocele 04/23/2019  . Dupuytren contracture 04/06/2018  . Onychomycosis 04/06/2018  . Hyperlipidemia 11/03/2017  . Age spots 10/31/2017  . Breast calcification, right 03/05/2017  . 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 Sesamoiditis 12/27/2016  . Tendonitis, Achilles, right 07/08/2016  . Left foot pain 07/08/2016  . 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 Precancerous skin lesion 08/14/2015  . Osteoporosis 09/15/2014  . Bunion, left foot 09/02/2014  . Cystocele 08/30/2014   Past Medical History:  Diagnosis Date  . Osteopetrosis     Family History  Problem Relation Age  of Onset  . Hypertension Mother   . Arthritis Mother   . Alzheimer's disease Mother   . Heart disease Mother   . Diabetes Father   . Alzheimer's disease Father   . Hypotension Father   . Cancer Maternal Aunt        breast  . Breast cancer Maternal Aunt   . Colon cancer Neg Hx     Past Surgical History:  Procedure Laterality Date  . TUBAL LIGATION     Social History   Occupational History  . 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 Smoking status: Never Smoker  . Smokeless tobacco: Never Used  Vaping Use  . Vaping Use: Never used  Substance and Sexual  Activity  . Alcohol use: Not Currently    Alcohol/week: 0.0 standard drinks  . Drug use: No  . Sexual activity: Not Currently

## 2020-04-08 LAB — COMPREHENSIVE METABOLIC PANEL
AG Ratio: 1.6 (calc) (ref 1.0–2.5)
ALT: 14 U/L (ref 6–29)
AST: 15 U/L (ref 10–35)
Albumin: 4.5 g/dL (ref 3.6–5.1)
Alkaline phosphatase (APISO): 63 U/L (ref 37–153)
BUN/Creatinine Ratio: 35 (calc) — ABNORMAL HIGH (ref 6–22)
BUN: 19 mg/dL (ref 7–25)
CO2: 26 mmol/L (ref 20–32)
Calcium: 10 mg/dL (ref 8.6–10.4)
Chloride: 104 mmol/L (ref 98–110)
Creat: 0.54 mg/dL — ABNORMAL LOW (ref 0.60–0.93)
Globulin: 2.8 g/dL (calc) (ref 1.9–3.7)
Glucose, Bld: 86 mg/dL (ref 65–99)
Potassium: 3.9 mmol/L (ref 3.5–5.3)
Sodium: 140 mmol/L (ref 135–146)
Total Bilirubin: 0.5 mg/dL (ref 0.2–1.2)
Total Protein: 7.3 g/dL (ref 6.1–8.1)

## 2020-04-08 LAB — VITAMIN D 25 HYDROXY (VIT D DEFICIENCY, FRACTURES): Vit D, 25-Hydroxy: 44 ng/mL (ref 30–100)

## 2020-04-08 LAB — CBC WITH DIFFERENTIAL/PLATELET
Absolute Monocytes: 749 cells/uL (ref 200–950)
Basophils Absolute: 49 cells/uL (ref 0–200)
Basophils Relative: 0.7 %
Eosinophils Absolute: 336 cells/uL (ref 15–500)
Eosinophils Relative: 4.8 %
HCT: 42.5 % (ref 35.0–45.0)
Hemoglobin: 14 g/dL (ref 11.7–15.5)
Lymphs Abs: 1603 cells/uL (ref 850–3900)
MCH: 29.6 pg (ref 27.0–33.0)
MCHC: 32.9 g/dL (ref 32.0–36.0)
MCV: 89.9 fL (ref 80.0–100.0)
MPV: 12.3 fL (ref 7.5–12.5)
Monocytes Relative: 10.7 %
Neutro Abs: 4263 cells/uL (ref 1500–7800)
Neutrophils Relative %: 60.9 %
Platelets: 197 10*3/uL (ref 140–400)
RBC: 4.73 10*6/uL (ref 3.80–5.10)
RDW: 12.4 % (ref 11.0–15.0)
Total Lymphocyte: 22.9 %
WBC: 7 10*3/uL (ref 3.8–10.8)

## 2020-04-08 LAB — LIPID PANEL
Cholesterol: 246 mg/dL — ABNORMAL HIGH (ref <200)
HDL: 80 mg/dL (ref >=50)
LDL Cholesterol (Calc): 146 mg/dL (calc) — ABNORMAL HIGH
Non-HDL Cholesterol (Calc): 166 mg/dL (calc) — ABNORMAL HIGH (ref <130)
Total CHOL/HDL Ratio: 3.1 (calc) (ref <5.0)
Triglycerides: 92 mg/dL (ref <150)

## 2020-04-08 LAB — HIGH SENSITIVITY CRP: hs-CRP: 2.3 mg/L

## 2020-04-08 LAB — THYROID PANEL WITH TSH
Free Thyroxine Index: 2.5 (ref 1.4–3.8)
T3 Uptake: 28 % (ref 22–35)
T4, Total: 8.9 ug/dL (ref 5.1–11.9)
TSH: 1.4 mIU/L (ref 0.40–4.50)

## 2020-04-10 ENCOUNTER — Telehealth: Payer: Self-pay | Admitting: Family Medicine

## 2020-04-10 NOTE — Telephone Encounter (Signed)
Labs are notable for the following:  Total cholesterol and LDL are elevated, but HDL is very good at 80.  Triglycerides are also very good at 92.  Could contemplate ordering a CT calcium score test to better evaluate cardiac risk.  Vitamin D looks good.  Thyroid studies are in normal range.  C-reactive protein and metabolic panel look good.  Complete blood count looks good.  Secure results sent to patient.

## 2020-04-26 ENCOUNTER — Ambulatory Visit (INDEPENDENT_AMBULATORY_CARE_PROVIDER_SITE_OTHER): Payer: Medicare HMO

## 2020-04-26 ENCOUNTER — Other Ambulatory Visit: Payer: Self-pay

## 2020-04-26 DIAGNOSIS — Z1231 Encounter for screening mammogram for malignant neoplasm of breast: Secondary | ICD-10-CM

## 2020-05-01 ENCOUNTER — Telehealth: Payer: Self-pay | Admitting: Family Medicine

## 2020-05-01 DIAGNOSIS — N812 Incomplete uterovaginal prolapse: Secondary | ICD-10-CM | POA: Diagnosis not present

## 2020-05-01 DIAGNOSIS — N8110 Cystocele, unspecified: Secondary | ICD-10-CM | POA: Diagnosis not present

## 2020-05-01 NOTE — Telephone Encounter (Signed)
Mammogram was normal.   

## 2020-05-02 NOTE — Telephone Encounter (Signed)
Patient states that she has not heard anything about her labs that were done at her physical appt.  She states that she does have myChart.

## 2020-05-02 NOTE — Telephone Encounter (Signed)
I called and advised her of her lab results. She states that she did not think to look in her email for the results

## 2020-05-02 NOTE — Telephone Encounter (Signed)
I had sent the results to her email since she's not signed up for [place].  But here's what I wrote:  Labs are notable for the following:   Total cholesterol and LDL are elevated, but HDL is very good at 80.  Triglycerides are also very good at 92.  Could contemplate ordering a CT calcium score test to better evaluate cardiac risk.   Vitamin D looks good.   Thyroid studies are in normal range.   C-reactive protein and metabolic panel look good.   Complete blood count looks good.

## 2020-05-04 DIAGNOSIS — N950 Postmenopausal bleeding: Secondary | ICD-10-CM | POA: Diagnosis not present

## 2020-07-08 ENCOUNTER — Emergency Department
Admission: EM | Admit: 2020-07-08 | Discharge: 2020-07-08 | Disposition: A | Discharge status: Home / Self Care | Payer: Self-pay | Source: Home / Self Care

## 2020-07-08 ENCOUNTER — Other Ambulatory Visit: Payer: Self-pay

## 2020-07-08 ENCOUNTER — Encounter: Payer: Self-pay | Admitting: Emergency Medicine

## 2020-07-08 DIAGNOSIS — U071 COVID-19: Secondary | ICD-10-CM

## 2020-07-08 DIAGNOSIS — R5383 Other fatigue: Secondary | ICD-10-CM | POA: Diagnosis not present

## 2020-07-08 DIAGNOSIS — J029 Acute pharyngitis, unspecified: Secondary | ICD-10-CM

## 2020-07-08 DIAGNOSIS — E860 Dehydration: Secondary | ICD-10-CM

## 2020-07-08 DIAGNOSIS — R630 Anorexia: Secondary | ICD-10-CM

## 2020-07-08 HISTORY — DX: COVID-19: U07.1

## 2020-07-08 LAB — POCT RAPID STREP A (OFFICE): Rapid Strep A Screen: NEGATIVE

## 2020-07-08 LAB — POCT URINALYSIS DIP (MANUAL ENTRY)
Bilirubin, UA: NEGATIVE
Blood, UA: NEGATIVE
Glucose, UA: NEGATIVE mg/dL
Ketones, POC UA: NEGATIVE mg/dL
Leukocytes, UA: NEGATIVE
Nitrite, UA: NEGATIVE
Protein Ur, POC: NEGATIVE mg/dL
Spec Grav, UA: 1.015 (ref 1.010–1.025)
Urobilinogen, UA: 0.2 E.U./dL
pH, UA: 6 (ref 5.0–8.0)

## 2020-07-08 MED ORDER — AZITHROMYCIN 250 MG PO TABS: 250.0000 mg | ORAL_TABLET | Freq: Every day | ORAL | 0 refills | Status: DC

## 2020-07-08 MED ORDER — CEPACOL INSTAMAX 15-20 MG MT LOZG: 1.0000 | LOZENGE | Freq: Four times a day (QID) | OROMUCOSAL | 0 refills | Status: DC | PRN

## 2020-07-08 MED ORDER — ONDANSETRON 4 MG PO TBDP: 4.0000 mg | ORAL_TABLET | Freq: Three times a day (TID) | ORAL | 0 refills | Status: DC | PRN

## 2020-07-08 NOTE — ED Provider Notes (Signed)
[facility]    CSN: [id number] Arrival date & time: 07/08/20  0802      History   Chief Complaint Chief Complaint  Patient presents with  . Fatigue    HPI Connie Burke is a 75 y.o. female.   HPI Connie Burke is a 75 y.o. female presenting to UC with c/o mild cough and continued fatigue, weakness, and loss of appetite for 12 days after testing positive for COVID at home.  She has taken Advil today.  She is able to tolerate soup but states anything else irritates her throat too much and states her tongue feels strange. No hx of thrush in the past. She is not on steroids or inhalers.  Denies fever, chills, vomiting or diarrhea. She did not get the COVID vaccine.    Past Medical History:  Diagnosis Date  . COVID-19   . Osteopetrosis     Patient Active Problem List   Diagnosis Date Noted  . Vitamin D deficiency 04/07/2020  . Incomplete uterine prolapse 06/22/2019  . Weakness of perineal floor 06/22/2019  . Female cystocele 04/23/2019  . Dupuytren contracture 04/06/2018  . Onychomycosis 04/06/2018  . Hyperlipidemia 11/03/2017  . Age spots 10/31/2017  . Breast calcification, right 03/05/2017  . 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 Sesamoiditis 12/27/2016  . Tendonitis, Achilles, right 07/08/2016  . Left foot pain 07/08/2016  . 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 Precancerous skin lesion 08/14/2015  . Osteoporosis 09/15/2014  . Bunion, left foot 09/02/2014  . Cystocele 08/30/2014    Past Surgical History:  Procedure Laterality Date  . BREAST BIOPSY    . TUBAL LIGATION      OB History    Gravida  4   Para  4   Term  4   Preterm      AB      Living  4     SAB      IAB      Ectopic      Multiple      Live Births           Obstetric Comments  SVD x 4. Largest 7lbs         Home Medications    Prior to Admission medications   Medication Sig Start Date End Date Taking? Authorizing Provider   azithromycin (ZITHROMAX) 250 MG tablet Take 1 tablet (250 mg total) by mouth daily. Take first 2 tablets together, then 1 every day until finished. 07/08/20  Yes Arliss Hepburn O, PA-C  Benzocaine-Menthol (CEPACOL INSTAMAX) 15-20 MG LOZG Use as directed 1 lozenge in the mouth or throat 4 (four) times daily as needed. 07/08/20  Yes Cyndal Kasson O, PA-C  celecoxib (CELEBREX) 100 MG capsule TAKE ONE CAPSULE BY MOUTH TWICE A DAY AS NEEDED PAIN 03/31/20  Yes Hilts, Legrand Como, MD  magnesium oxide (MAG-OX) 400 MG tablet Take by mouth.   Yes [provider]  Omega-3 Fatty Acids (FISH OIL) 1200 MG CAPS Take 1 capsule by mouth 2 (two) times a week.   Yes [provider]  ondansetron (ZOFRAN-ODT) 4 MG disintegrating tablet Take 1 tablet (4 mg total) by mouth every 8 (eight) hours as needed for nausea or vomiting. 07/08/20  Yes Khiara Shuping O, PA-C  Vitamin D, Cholecalciferol, 10 MCG (400 UNIT) CAPS Take 2 tablets by mouth daily.   Yes [provider]  ibuprofen (ADVIL) 200 MG tablet Take 200 mg by mouth every 6 (six) hours as needed.  [provider]    Family History Family History  Problem Relation Age of Onset  . Hypertension Mother   . Arthritis Mother   . Alzheimer's disease Mother   . Heart disease Mother   . Diabetes Father   . Alzheimer's disease Father   . Hypotension Father   . Cancer Maternal Aunt        breast  . Breast cancer Maternal Aunt   . Colon cancer Neg Hx     Social History Social History   Tobacco Use  . Smoking status: Never Smoker  . Smokeless tobacco: Never Used  Vaping Use  . Vaping Use: Never used  Substance Use Topics  . Alcohol use: Not Currently    Alcohol/week: 0.0 standard drinks  . Drug use: No     Allergies   Chlorhexidine and Amoxicillin   Review of Systems Review of Systems  Constitutional: Positive for appetite change and fatigue. Negative for chills and fever.  HENT: Positive for congestion and sore throat.  Negative for ear pain, trouble swallowing and voice change.   Respiratory: Positive for cough. Negative for shortness of breath.   Cardiovascular: Negative for chest pain and palpitations.  Gastrointestinal: Positive for nausea. Negative for abdominal pain, diarrhea and vomiting.  Musculoskeletal: Negative for arthralgias, back pain and myalgias.  Skin: Negative for rash.  Neurological: Positive for weakness. Negative for dizziness, light-headedness and headaches.  All other systems reviewed and are negative.    Physical Exam Triage Vital Signs ED Triage Vitals  Enc Vitals Group     BP 07/08/20 0825 117/81     Pulse Rate 07/08/20 0825 81     Resp 07/08/20 0825 17     Temp 07/08/20 0825 99 F (37.2 C)     Temp Source 07/08/20 0825 Oral     SpO2 07/08/20 0825 95 %     Weight 07/08/20 0832 124 lb (56.2 kg)     Height 07/08/20 0826 5\' 3"  (1.6 m)     Head Circumference --      Peak Flow --      Pain Score 07/08/20 0826 0     Pain Loc --      Pain Edu? --      Excl. in [place]? --    No data found.  Updated Vital Signs BP 117/81 (BP Location: Right Arm)   Pulse 81   Temp 99 F (37.2 C) (Oral)   Resp 17   Ht 5\' 3"  (1.6 m)   Wt 124 lb (56.2 kg)   SpO2 95%   BMI 21.97 kg/m   Visual Acuity Right Eye Distance:   Left Eye Distance:   Bilateral Distance:    Right Eye Near:   Left Eye Near:    Bilateral Near:     Physical Exam Vitals and nursing note reviewed.  Constitutional:      General: She is not in acute distress.    Appearance: Normal appearance. She is well-developed and well-nourished. She is not ill-appearing, toxic-appearing or diaphoretic.  HENT:     Head: Normocephalic and atraumatic.     Right Ear: Tympanic membrane and ear canal normal.     Left Ear: Tympanic membrane and ear canal normal.     Nose: Congestion present.     Right Sinus: No maxillary sinus tenderness or frontal sinus tenderness.     Left Sinus: No maxillary sinus tenderness or frontal sinus  tenderness.     Mouth/Throat:     Lips: Pink.  Mouth: Mucous membranes are dry.     Pharynx: Oropharynx is clear. Uvula midline. Posterior oropharyngeal erythema present. No pharyngeal swelling, oropharyngeal exudate or uvula swelling.  Eyes:     Extraocular Movements: EOM normal.  Cardiovascular:     Rate and Rhythm: Normal rate and regular rhythm.  Pulmonary:     Effort: Pulmonary effort is normal. No respiratory distress.     Breath sounds: Normal breath sounds. No stridor. No wheezing, rhonchi or rales.  Musculoskeletal:        General: Normal range of motion.     Cervical back: Normal range of motion and neck supple. No tenderness.  Lymphadenopathy:     Cervical: No cervical adenopathy.  Skin:    General: Skin is warm and dry.  Neurological:     Mental Status: She is alert and oriented to person, place, and time.  Psychiatric:        Mood and Affect: Mood and affect normal.        Behavior: Behavior normal.      UC Treatments / Results  Labs (all labs ordered are listed, but only abnormal results are displayed) Labs Reviewed  POCT URINALYSIS DIP (MANUAL ENTRY) - Abnormal; Notable for the following components:      Result Value   Color, UA straw (*)    All other components within normal limits  STREP A DNA PROBE  COMPLETE METABOLIC PANEL WITH GFR  CBC WITH DIFFERENTIAL/PLATELET  POCT RAPID STREP A (OFFICE)    EKG   Radiology No results found.  Procedures Procedures (including critical care time)  Medications Ordered in UC Medications - No data to display  Initial Impression / Assessment and Plan / UC Course  I have reviewed the triage vital signs and the nursing notes.  Pertinent labs & imaging results that were available during my care of the patient were reviewed by me and considered in my medical decision making (see chart for details).     Pt c/o fatigue and generalized weakness, sore throat and tongue pain making it hard for pt to eat and  drink. Tongue appears slightly dry and erythematous but no discharge, not c/w thrush   Rapid strep: NEGATIVE Culture sent  Lungs: CTAB, no respiratory distress, O2 Sat 95% on RA, HR 81 Abd: soft, non-tender UA: urine is straw in color but otherwise normal  Hx and exam c/w mild dehydration without evidence of bacterial infection at this time, prescription to hold for azithromycin to cover for secondary bacterial infection if fever develops or cough worsens.   Encouraged good hydration and close f/u with PCP or [facility] next week for recheck of symptoms. Discussed symptoms that warrant emergent care in the ED. AVS given  Final Clinical Impressions(s) / UC Diagnoses   Final diagnoses:  Fatigue, unspecified type  Acute pharyngitis, unspecified etiology  COVID  Loss of appetite  Mild dehydration  Sore throat     Discharge Instructions     You can alternate acetaminophen (Tylenol) and ibuprofen (Motrin or Advil) and use saltwater gargles and throat lozenges to help with throat pain.    Be sure to stay well hydrated, drinking enough clear fluids to keep your urine clear to pale yellow in color.  You should consume drinks with electrolytes such as Gatorade and Pedialtye and eat soups/broths to help regain some strength/energy as well.   You can stick with soft diet such as apple sauce, jell-o, soups, mashed potatoes, bread, bananas, or even smoothies or shakes to  help keep you nourished with limiting irritation to your sore throat.  Call Monday to schedule a follow up with primary care or the [facility] for recheck of symptoms next week.   Call 911 or have someone drive you to the hospital if symptoms significantly worsening- dizziness/passing out, chest pain, trouble breathing, unable to keep down fluids, or other new concerning symptoms develop.      ED Prescriptions    Medication Sig Dispense Auth. Provider   Benzocaine-Menthol (CEPACOL INSTAMAX) 15-20 MG  LOZG Use as directed 1 lozenge in the mouth or throat 4 (four) times daily as needed. 16 lozenge Ayesha Markwell O, PA-C   ondansetron (ZOFRAN-ODT) 4 MG disintegrating tablet Take 1 tablet (4 mg total) by mouth every 8 (eight) hours as needed for nausea or vomiting. 12 tablet Gerarda Fraction, Korion Cuevas O, PA-C   azithromycin (ZITHROMAX) 250 MG tablet Take 1 tablet (250 mg total) by mouth daily. Take first 2 tablets together, then 1 every day until finished. 6 tablet Noe Gens, PA-C     PDMP not reviewed this encounter.   Noe Gens, [place] 07/08/20 1108

## 2020-07-08 NOTE — ED Triage Notes (Signed)
Dx at home w/ COVID 12 days ago  Here today for continued fatigue & mild cough Denies fever  OTC advil today  Tolerates soup C/o tongue feeling strange, dry  No COVID vaccine

## 2020-07-08 NOTE — Discharge Instructions (Signed)
You can alternate acetaminophen (Tylenol) and ibuprofen (Motrin or Advil) and use saltwater gargles and throat lozenges to help with throat pain.    Be sure to stay well hydrated, drinking enough clear fluids to keep your urine clear to pale yellow in color.  You should consume drinks with electrolytes such as Gatorade and Pedialtye and eat soups/broths to help regain some strength/energy as well.   You can stick with soft diet such as apple sauce, jell-o, soups, mashed potatoes, bread, bananas, or even smoothies or shakes to help keep you nourished with limiting irritation to your sore throat.  Call Monday to schedule a follow up with primary care or the [facility] for recheck of symptoms next week.   Call 911 or have someone drive you to the hospital if symptoms significantly worsening- dizziness/passing out, chest pain, trouble breathing, unable to keep down fluids, or other new concerning symptoms develop.

## 2020-07-09 ENCOUNTER — Telehealth: Payer: Self-pay | Admitting: Emergency Medicine

## 2020-07-09 LAB — CBC WITH DIFFERENTIAL/PLATELET
Absolute Monocytes: 633 cells/uL (ref 200–950)
Basophils Absolute: 22 cells/uL (ref 0–200)
Basophils Relative: 0.4 %
Eosinophils Absolute: 22 cells/uL (ref 15–500)
Eosinophils Relative: 0.4 %
HCT: 40.7 % (ref 35.0–45.0)
Hemoglobin: 13.6 g/dL (ref 11.7–15.5)
Lymphs Abs: 734 cells/uL — ABNORMAL LOW (ref 850–3900)
MCH: 29.4 pg (ref 27.0–33.0)
MCHC: 33.4 g/dL (ref 32.0–36.0)
MCV: 88.1 fL (ref 80.0–100.0)
MPV: 12.8 fL — ABNORMAL HIGH (ref 7.5–12.5)
Monocytes Relative: 11.3 %
Neutro Abs: 4189 cells/uL (ref 1500–7800)
Neutrophils Relative %: 74.8 %
Platelets: 142 10*3/uL (ref 140–400)
RBC: 4.62 10*6/uL (ref 3.80–5.10)
RDW: 12.4 % (ref 11.0–15.0)
Total Lymphocyte: 13.1 %
WBC: 5.6 10*3/uL (ref 3.8–10.8)

## 2020-07-09 LAB — COMPLETE METABOLIC PANEL WITH GFR
AG Ratio: 1.6 (calc) (ref 1.0–2.5)
ALT: 18 U/L (ref 6–29)
AST: 18 U/L (ref 10–35)
Albumin: 4 g/dL (ref 3.6–5.1)
Alkaline phosphatase (APISO): 70 U/L (ref 37–153)
BUN: 15 mg/dL (ref 7–25)
CO2: 28 mmol/L (ref 20–32)
Calcium: 9 mg/dL (ref 8.6–10.4)
Chloride: 106 mmol/L (ref 98–110)
Creat: 0.7 mg/dL (ref 0.60–0.93)
GFR, Est African American: 100 mL/min/{1.73_m2} (ref >=60)
GFR, Est Non African American: 87 mL/min/{1.73_m2} (ref >=60)
Globulin: 2.5 g/dL (calc) (ref 1.9–3.7)
Glucose, Bld: 101 mg/dL — ABNORMAL HIGH (ref 65–99)
Potassium: 4.1 mmol/L (ref 3.5–5.3)
Sodium: 142 mmol/L (ref 135–146)
Total Bilirubin: 0.4 mg/dL (ref 0.2–1.2)
Total Protein: 6.5 g/dL (ref 6.1–8.1)

## 2020-07-09 NOTE — Telephone Encounter (Signed)
Return call to patient regarding questions about meds. Patient's neighbor picked up medicine, was only able to get z-pak. Pt states she wasn't sure if there were any other prescriptions. Neighbor picked up cepacol from another pharmacy (unknown if it was the prescription). Zofran was not picked up. Pt directed to call [facility] Oppen[facility] about zofran script, sent in separately from z-pak. Pt also updated on CBC results - all with in normal range per provider  Leeroy Cha, PA-C), awaiting CMP & strep culture results. Pt states she will wait ton starting z-pak until strep culture iis back. Pt has increased PO fluids and is alternating Motrin & Tylenol. She states she is not taking celebrex for arthritis while taking motrin/advil. No other questions at this time.

## 2020-07-10 LAB — CULTURE, GROUP A STREP
MICRO NUMBER:: 11501074
SPECIMEN QUALITY:: ADEQUATE

## 2020-07-12 ENCOUNTER — Ambulatory Visit: Payer: Medicare HMO | Admitting: Family Medicine

## 2020-10-11 DIAGNOSIS — H2513 Age-related nuclear cataract, bilateral: Secondary | ICD-10-CM | POA: Diagnosis not present

## 2020-10-11 DIAGNOSIS — H524 Presbyopia: Secondary | ICD-10-CM | POA: Diagnosis not present

## 2020-11-21 DIAGNOSIS — N812 Incomplete uterovaginal prolapse: Secondary | ICD-10-CM | POA: Diagnosis not present

## 2020-11-21 DIAGNOSIS — Z01818 Encounter for other preprocedural examination: Secondary | ICD-10-CM | POA: Diagnosis not present

## 2020-11-21 DIAGNOSIS — N950 Postmenopausal bleeding: Secondary | ICD-10-CM | POA: Diagnosis not present

## 2020-11-30 DIAGNOSIS — H6123 Impacted cerumen, bilateral: Secondary | ICD-10-CM | POA: Diagnosis not present

## 2020-12-01 HISTORY — PX: CYSTECTOMY: SHX5119

## 2020-12-15 DIAGNOSIS — N813 Complete uterovaginal prolapse: Secondary | ICD-10-CM | POA: Diagnosis not present

## 2020-12-15 DIAGNOSIS — N812 Incomplete uterovaginal prolapse: Secondary | ICD-10-CM | POA: Diagnosis not present

## 2020-12-15 DIAGNOSIS — M549 Dorsalgia, unspecified: Secondary | ICD-10-CM | POA: Diagnosis not present

## 2020-12-15 DIAGNOSIS — Z880 Allergy status to penicillin: Secondary | ICD-10-CM | POA: Diagnosis not present

## 2020-12-15 DIAGNOSIS — N8110 Cystocele, unspecified: Secondary | ICD-10-CM | POA: Diagnosis not present

## 2021-01-02 ENCOUNTER — Other Ambulatory Visit: Payer: Self-pay | Admitting: Family Medicine

## 2021-01-02 DIAGNOSIS — M19049 Primary osteoarthritis, unspecified hand: Secondary | ICD-10-CM

## 2021-02-02 DIAGNOSIS — N393 Stress incontinence (female) (male): Secondary | ICD-10-CM | POA: Diagnosis not present

## 2021-02-12 DIAGNOSIS — L400 Psoriasis vulgaris: Secondary | ICD-10-CM | POA: Diagnosis not present

## 2021-02-12 DIAGNOSIS — D1801 Hemangioma of skin and subcutaneous tissue: Secondary | ICD-10-CM | POA: Diagnosis not present

## 2021-02-12 DIAGNOSIS — L814 Other melanin hyperpigmentation: Secondary | ICD-10-CM | POA: Diagnosis not present

## 2021-02-12 DIAGNOSIS — L821 Other seborrheic keratosis: Secondary | ICD-10-CM | POA: Diagnosis not present

## 2021-02-12 DIAGNOSIS — L579 Skin changes due to chronic exposure to nonionizing radiation, unspecified: Secondary | ICD-10-CM | POA: Diagnosis not present

## 2021-02-12 DIAGNOSIS — L570 Actinic keratosis: Secondary | ICD-10-CM | POA: Diagnosis not present

## 2021-03-21 ENCOUNTER — Other Ambulatory Visit: Payer: Self-pay | Admitting: Family Medicine

## 2021-03-21 DIAGNOSIS — Z1231 Encounter for screening mammogram for malignant neoplasm of breast: Secondary | ICD-10-CM

## 2021-05-09 ENCOUNTER — Ambulatory Visit (INDEPENDENT_AMBULATORY_CARE_PROVIDER_SITE_OTHER): Payer: Medicare HMO | Admitting: Family Medicine

## 2021-05-09 ENCOUNTER — Encounter: Payer: Self-pay | Admitting: Family Medicine

## 2021-05-09 ENCOUNTER — Other Ambulatory Visit: Payer: Self-pay

## 2021-05-09 ENCOUNTER — Ambulatory Visit (INDEPENDENT_AMBULATORY_CARE_PROVIDER_SITE_OTHER): Payer: Medicare HMO

## 2021-05-09 DIAGNOSIS — M1991 Primary osteoarthritis, unspecified site: Secondary | ICD-10-CM | POA: Diagnosis not present

## 2021-05-09 DIAGNOSIS — M810 Age-related osteoporosis without current pathological fracture: Secondary | ICD-10-CM | POA: Diagnosis not present

## 2021-05-09 DIAGNOSIS — M1990 Unspecified osteoarthritis, unspecified site: Secondary | ICD-10-CM | POA: Insufficient documentation

## 2021-05-09 DIAGNOSIS — M19049 Primary osteoarthritis, unspecified hand: Secondary | ICD-10-CM | POA: Diagnosis not present

## 2021-05-09 DIAGNOSIS — Z1231 Encounter for screening mammogram for malignant neoplasm of breast: Secondary | ICD-10-CM | POA: Diagnosis not present

## 2021-05-09 MED ORDER — CELECOXIB 100 MG PO CAPS
ORAL_CAPSULE | ORAL | 1 refills | Status: DC
Start: 2021-05-09 — End: 2022-02-22

## 2021-05-09 NOTE — Assessment & Plan Note (Signed)
Celebrex is working fairly well for her.  We will continue this at current strength that she is tolerating well.

## 2021-05-09 NOTE — Assessment & Plan Note (Signed)
Taking calcium and vitamin D.  Due for repeat DEXA.  We will order this at her upcoming annual exam.

## 2021-05-09 NOTE — Patient Instructions (Signed)
Very nice to meet you today! Follow up for annual exam/fasting labs.

## 2021-05-09 NOTE — Progress Notes (Signed)
Connie Burke - 75 y.o. female MRN [id number]  Date of birth: 05-19-1946  Subjective Chief Complaint  Patient presents with   Establish Care    HPI Connie Burke is a 75 year old female here today to establish care.  She has been a patient of our clinic previously however established with a another primary care provider for the past couple years.  She does have a history of osteoarthritis and osteoporosis.  She is taking vitamin D and calcium supplement.  Arthritis is managed with Celebrex.  She does need a renewal of Celebrex at this time.  She plans to schedule an appointment for annual exam and lab work soon.  ROS:  A comprehensive ROS was completed and negative except as noted per HPI  Allergies  Allergen Reactions   Chlorhexidine     Other reaction(s): Other (See Comments) Burning sensation on skin   Amoxicillin     Does not prefer to take due to childs reaction to medication with rash and abdominal pain.     Past Medical History:  Diagnosis Date   COVID-19    Osteopetrosis     Past Surgical History:  Procedure Laterality Date   BREAST BIOPSY     TUBAL LIGATION      Social History   Socioeconomic History   Marital status: Single    Spouse name: Not on file   Number of children: 4   Years of education: 46   Highest education level: Some college, no degree  Occupational History   Occupation: Engineer, materials  Tobacco Use   Smoking status: Never    Passive exposure: Never   Smokeless tobacco: Never  Vaping Use   Vaping Use: Never used  Substance and Sexual Activity   Alcohol use: Not Currently    Alcohol/week: 0.0 standard drinks   Drug use: No   Sexual activity: Not Currently  Other Topics Concern   Not on file  Social History Narrative   Walks dog daily everyday. Member of the prayer shawl club at her church.   Social Determinants of Health   Financial Resource Strain: Not on file  Food Insecurity: Not on file  Transportation Needs: Not on file   Physical Activity: Not on file  Stress: Not on file  Social Connections: Not on file    Family History  Problem Relation Age of Onset   Hypertension Mother    Arthritis Mother    Alzheimer's disease Mother    Heart disease Mother    Diabetes Father    Alzheimer's disease Father    Hypotension Father    Cancer Maternal Aunt        breast   Breast cancer Maternal Aunt    Colon cancer Neg Hx     Health Maintenance  Topic Date Due   COVID-19 Vaccine (1) Never done   Pneumonia Vaccine 30+ Years old (1 - PCV) Never done   TETANUS/TDAP  Never done   Zoster Vaccines- Shingrix (1 of 2) Never done   INFLUENZA VACCINE  Never done   MAMMOGRAM  05/09/2022   COLONOSCOPY (Pts 45-22yrs Insurance coverage will need to be confirmed)  07/05/2023   DEXA SCAN  Completed   Hepatitis C Screening  Completed   HPV VACCINES  Aged Out     ----------------------------------------------------------------------------------------------------------------------------------------------------------------------------------------------------------------- Physical Exam BP 127/67   Pulse 71   Temp 98.1 F (36.7 C)   Ht 5' 2.6" (1.59 m)   Wt 130 lb (59 kg)   SpO2 100%   BMI 23.33  kg/m   Physical Exam Constitutional:      Appearance: Normal appearance.  Neurological:     Mental Status: She is alert.  Psychiatric:        Mood and Affect: Mood normal.        Behavior: Behavior normal.    ------------------------------------------------------------------------------------------------------------------------------------------------------------------------------------------------------------------- Assessment and Plan  Osteoarthritis Celebrex is working fairly well for her.  We will continue this at current strength that she is tolerating well.  Osteoporosis Taking calcium and vitamin D.  Due for repeat DEXA.  We will order this at her upcoming annual exam.   Meds ordered this encounter   Medications   celecoxib (CELEBREX) 100 MG capsule    Sig: TAKE 1 CAPSULE BY MOUTH TWO TIMES A DAY AS NEEDED    Dispense:  180 capsule    Refill:  1    No follow-ups on file.    This visit occurred during the SARS-CoV-2 public health emergency.  Safety protocols were in place, including screening questions prior to the visit, additional usage of staff PPE, and extensive cleaning of exam room while observing appropriate contact time as indicated for disinfecting solutions.

## 2021-06-11 DIAGNOSIS — D485 Neoplasm of uncertain behavior of skin: Secondary | ICD-10-CM | POA: Diagnosis not present

## 2021-06-11 DIAGNOSIS — L570 Actinic keratosis: Secondary | ICD-10-CM | POA: Diagnosis not present

## 2021-06-13 ENCOUNTER — Encounter: Payer: Self-pay | Admitting: Family Medicine

## 2021-06-13 ENCOUNTER — Other Ambulatory Visit: Payer: Self-pay

## 2021-06-13 ENCOUNTER — Ambulatory Visit (INDEPENDENT_AMBULATORY_CARE_PROVIDER_SITE_OTHER): Payer: Medicare HMO | Admitting: Family Medicine

## 2021-06-13 VITALS — BP 116/67 | HR 72 | Temp 98.6°F | Ht 63.0 in | Wt 127.1 lb

## 2021-06-13 DIAGNOSIS — M810 Age-related osteoporosis without current pathological fracture: Secondary | ICD-10-CM | POA: Diagnosis not present

## 2021-06-13 DIAGNOSIS — K1370 Unspecified lesions of oral mucosa: Secondary | ICD-10-CM

## 2021-06-13 DIAGNOSIS — E782 Mixed hyperlipidemia: Secondary | ICD-10-CM | POA: Diagnosis not present

## 2021-06-13 DIAGNOSIS — Z0000 Encounter for general adult medical examination without abnormal findings: Secondary | ICD-10-CM

## 2021-06-13 NOTE — Progress Notes (Signed)
Connie Burke - 76 y.o. female MRN [id number]  Date of birth: [birth year]  Subjective Chief Complaint  Patient presents with   Annual Exam    HPI Connie Burke is a 76 year old female here today for annual exam.  Reports she is doing well.  She does report that her dentist noticed a pigmented lesion on the buccal mucosa on the right cheek.  She has not noticed any pain.  This is been present for a few weeks.  She was referred to dermatology however dermatology said that she needed to see ENT or oral surgeon.  So far she has not heard anything on this referral.  She does stay very active and feels like her diet is pretty healthy.  She is a non-smoker.  She does not consume alcohol regularly.  Review of Systems  Constitutional:  Negative for chills, fever, malaise/fatigue and weight loss.  HENT:  Negative for congestion, ear pain and sore throat.   Eyes:  Negative for blurred vision, double vision and pain.  Respiratory:  Negative for cough and shortness of breath.   Cardiovascular:  Negative for chest pain and palpitations.  Gastrointestinal:  Negative for abdominal pain, blood in stool, constipation, heartburn and nausea.  Genitourinary:  Negative for dysuria and urgency.  Musculoskeletal:  Negative for joint pain and myalgias.  Neurological:  Negative for dizziness and headaches.  Endo/Heme/Allergies:  Does not bruise/bleed easily.  Psychiatric/Behavioral:  Negative for depression. The patient is not nervous/anxious and does not have insomnia.    Allergies  Allergen Reactions   Chlorhexidine     Other reaction(s): Other (See Comments) Burning sensation on skin   Amoxicillin     Does not prefer to take due to childs reaction to medication with rash and abdominal pain.     Past Medical History:  Diagnosis Date   COVID-19    Osteopetrosis     Past Surgical History:  Procedure Laterality Date   BREAST BIOPSY     CYSTECTOMY  12/2020   per patient - completed at [facility]   Marital status: Single    Spouse name: Not on file   Number of children: 4   Years of education: 12   Highest education level: Some college, no degree  Occupational History   Occupation: Engineer, materials  Tobacco Use   Smoking status: Never    Passive exposure: Never   Smokeless tobacco: Never  Vaping Use   Vaping Use: Never used  Substance and Sexual Activity   Alcohol use: Not Currently    Alcohol/week: 0.0 standard drinks   Drug use: No   Sexual activity: Not Currently  Other Topics Concern   Not on file  Social History Narrative   Walks dog daily everyday. Member of the prayer shaw[employer] club at her church.   Social Determinants of Health   Financial Resource Strain: Not on file  Food Insecurity: Not on file  Transportation Needs: Not on file  Physical Activity: Not on file  Stress: Not on file  Social Connections: Not on file    Family History  Problem Relation Age of Onset   Hypertension Mother    Arthritis Mother    Alzheimer's disease Mother    Heart disease Mother    Diabetes Father    Alzheimer's disease Father    Hypotension Father    Cancer Maternal Aunt        breast   Breast  cancer Maternal Aunt    Colon cancer Neg Hx     Health Maintenance  Topic Date Due   COVID-19 Vaccine (1) 06/29/2021 (Originally 09/13/1946)   INFLUENZA VACCINE  08/31/2021 (Originally 01/01/2021)   Zoster Vaccines- Shingrix (1 of 2) 09/11/2021 (Originally 03/16/1967)   Pneumonia Vaccine 48+ Years old (1 - PCV) 06/13/2022 (Originally 03/15/1952)   TETANUS/TDAP  06/13/2022 (Originally 03/16/1967)   MAMMOGRAM  05/09/2022   COLONOSCOPY (Pts 45-34yrs Insurance coverage will need to be confirmed)  07/05/2023   DEXA SCAN  Completed   Hepatitis C Screening  Completed   HPV VACCINES  Aged Out      ----------------------------------------------------------------------------------------------------------------------------------------------------------------------------------------------------------------- Physical Exam BP 116/67 (BP Location: Left Arm, Patient Position: Sitting, Cuff Size: Normal)    Pulse 72    Temp 98.6 F (37 C) (Oral)    Ht 5\' 3"  (1.6 m)    Wt 127 lb 1.9 oz (57.7 kg)    SpO2 100%    BMI 22.52 kg/m   Physical Exam Constitutional:      General: She is not in acute distress. HENT:     Head: Normocephalic and atraumatic.     Right Ear: Tympanic membrane and ear canal normal.     Left Ear: Tympanic membrane and ear canal normal.     Nose: Nose normal.  Eyes:     General: No scleral icterus.    Conjunctiva/sclera: Conjunctivae normal.  Neck:     Thyroid: No thyromegaly.  Cardiovascular:     Rate and Rhythm: Normal rate and regular rhythm.     Heart sounds: Normal heart sounds.  Pulmonary:     Effort: Pulmonary effort is normal.     Breath sounds: Normal breath sounds.  Abdominal:     General: Bowel sounds are normal. There is no distension.     Palpations: Abdomen is soft.     Tenderness: There is no abdominal tenderness. There is no guarding.  Musculoskeletal:        General: Normal range of motion.     Cervical back: Normal range of motion and neck supple.  Lymphadenopathy:     Cervical: No cervical adenopathy.  Skin:    General: Skin is warm and dry.     Findings: No rash.  Neurological:     General: No focal deficit present.     Mental Status: She is alert and oriented to person, place, and time.     Cranial Nerves: No cranial nerve deficit.     Coordination: Coordination normal.  Psychiatric:        Mood and Affect: Mood normal.        Behavior: Behavior normal.     ------------------------------------------------------------------------------------------------------------------------------------------------------------------------------------------------------------------- Assessment and Plan  Well adult exam Well adult Orders Placed This Encounter  Procedures   COMPLETE METABOLIC PANEL WITH GFR   CBC with Differential   Lipid Panel w/reflex Direct LDL   Vitamin D (25 hydroxy)   Ambulatory referral to Oral Maxillofacial Surgery    Referral Priority:   Routine    Referral Type:   Surgical    Referral Reason:   Specialty Services Required    Requested Specialty:   Oral Surgery    Number of Visits Requested:   1  Screening: Per lab orders.  Up-to-date on colon cancer screening, mammogram  and bone density Immunizations: Declines recommended immunizations at this time. Anticipatory guidance/risk factor reduction: Recommendations per AVS.  Oral lesion Referral placed to oral surgery.   No orders of the defined types were placed  in this encounter.   No follow-ups on file.    This visit occurred during the SARS-CoV-2 public health emergency.  Safety protocols were in place, including screening questions prior to the visit, additional usage of staff PPE, and extensive cleaning of exam room while observing appropriate contact time as indicated for disinfecting solutions.

## 2021-06-13 NOTE — Assessment & Plan Note (Signed)
Well adult Orders Placed This Encounter  Procedures   COMPLETE METABOLIC PANEL WITH GFR   CBC with Differential   Lipid Panel w/reflex Direct LDL   Vitamin D (25 hydroxy)   Ambulatory referral to Oral Maxillofacial Surgery    Referral Priority:   Routine    Referral Type:   Surgical    Referral Reason:   Specialty Services Required    Requested Specialty:   Oral Surgery    Number of Visits Requested:   1  Screening: Per lab orders.  Up-to-date on colon cancer screening, mammogram  and bone density Immunizations: Declines recommended immunizations at this time. Anticipatory guidance/risk factor reduction: Recommendations per AVS.

## 2021-06-13 NOTE — Patient Instructions (Signed)
Preventive Care 65 Years and Older, Female °Preventive care refers to lifestyle choices and visits with your health care provider that can promote health and wellness. Preventive care visits are also called wellness exams. °What can I expect for my preventive care visit? °Counseling °Your health care provider may ask you questions about your: °Medical history, including: °Past medical problems. °Family medical history. °Pregnancy and menstrual history. °History of falls. °Current health, including: °Memory and ability to understand (cognition). °Emotional well-being. °Home life and relationship well-being. °Sexual activity and sexual health. °Lifestyle, including: °Alcohol, nicotine or tobacco, and drug use. °Access to firearms. °Diet, exercise, and sleep habits. °Work and work environment. °Sunscreen use. °Safety issues such as seatbelt and bike helmet use. °Physical exam °Your health care provider will check your: °Height and weight. These may be used to calculate your BMI (body mass index). BMI is a measurement that tells if you are at a healthy weight. °Waist circumference. This measures the distance around your waistline. This measurement also tells if you are at a healthy weight and may help predict your risk of certain diseases, such as type 2 diabetes and high blood pressure. °Heart rate and blood pressure. °Body temperature. °Skin for abnormal spots. °What immunizations do I need? °Vaccines are usually given at various ages, according to a schedule. Your health care provider will recommend vaccines for you based on your age, medical history, and lifestyle or other factors, such as travel or where you work. °What tests do I need? °Screening °Your health care provider may recommend screening tests for certain conditions. This may include: °Lipid and cholesterol levels. °Hepatitis C test. °Hepatitis B test. °HIV (human immunodeficiency virus) test. °STI (sexually transmitted infection) testing, if you are at  risk. °Lung cancer screening. °Colorectal cancer screening. °Diabetes screening. This is done by checking your blood sugar (glucose) after you have not eaten for a while (fasting). °Mammogram. Talk with your health care provider about how often you should have regular mammograms. °BRCA-related cancer screening. This may be done if you have a family history of breast, ovarian, tubal, or peritoneal cancers. °Bone density scan. This is done to screen for osteoporosis. °Talk with your health care provider about your test results, treatment options, and if necessary, the need for more tests. °Follow these instructions at home: °Eating and drinking ° °Eat a diet that includes fresh fruits and vegetables, whole grains, lean protein, and low-fat dairy products. Limit your intake of foods with high amounts of sugar, saturated fats, and salt. °Take vitamin and mineral supplements as recommended by your health care provider. °Do not drink alcohol if your health care provider tells you not to drink. °If you drink alcohol: °Limit how much you have to 0-1 drink a day. °Know how much alcohol is in your drink. In the [place] one drink equals one 12 oz bottle of beer (355 mL), one 5 oz glass of wine (148 mL), or one 1½ oz glass of hard liquor (44 mL). °Lifestyle °Brush your teeth every morning and night with fluoride toothpaste. Floss one time each day. °Exercise for at least 30 minutes 5 or more days each week. °Do not use any products that contain nicotine or tobacco. These products include cigarettes, chewing tobacco, and vaping devices, such as e-cigarettes. If you need help quitting, ask your health care provider. °Do not use drugs. °If you are sexually active, practice safe sex. Use a condom or other form of protection in order to prevent STIs. °Take aspirin only as told by your   health care provider. Make sure that you understand how much to take and what form to take. Work with your health care provider to find out whether it  is safe and beneficial for you to take aspirin daily. Ask your health care provider if you need to take a cholesterol-lowering medicine (statin). Find healthy ways to manage stress, such as: Meditation, yoga, or listening to music. Journaling. Talking to a trusted person. Spending time with friends and family. Minimize exposure to UV radiation to reduce your risk of skin cancer. Safety Always wear your seat belt while driving or riding in a vehicle. Do not drive: If you have been drinking alcohol. Do not ride with someone who has been drinking. When you are tired or distracted. While texting. If you have been using any mind-altering substances or drugs. Wear a helmet and other protective equipment during sports activities. If you have firearms in your house, make sure you follow all gun safety procedures. What's next? Visit your health care provider once a year for an annual wellness visit. Ask your health care provider how often you should have your eyes and teeth checked. Stay up to date on all vaccines. This information is not intended to replace advice given to you by your health care provider. Make sure you discuss any questions you have with your health care provider. Document Revised: 11/15/2020 Document Reviewed: 11/15/2020 Elsevier Patient Education  [place].

## 2021-06-13 NOTE — Assessment & Plan Note (Signed)
Referral placed to oral surgery.

## 2021-06-14 LAB — COMPLETE METABOLIC PANEL WITH GFR
AG Ratio: 1.9 (calc) (ref 1.0–2.5)
ALT: 11 U/L (ref 6–29)
AST: 13 U/L (ref 10–35)
Albumin: 4.5 g/dL (ref 3.6–5.1)
Alkaline phosphatase (APISO): 75 U/L (ref 37–153)
BUN: 19 mg/dL (ref 7–25)
CO2: 30 mmol/L (ref 20–32)
Calcium: 9.8 mg/dL (ref 8.6–10.4)
Chloride: 105 mmol/L (ref 98–110)
Creat: 0.6 mg/dL (ref 0.60–1.00)
Globulin: 2.4 g/dL (calc) (ref 1.9–3.7)
Glucose, Bld: 88 mg/dL (ref 65–99)
Potassium: 4.4 mmol/L (ref 3.5–5.3)
Sodium: 140 mmol/L (ref 135–146)
Total Bilirubin: 0.5 mg/dL (ref 0.2–1.2)
Total Protein: 6.9 g/dL (ref 6.1–8.1)
eGFR: 95 mL/min/{1.73_m2} (ref >=60)

## 2021-06-14 LAB — CBC WITH DIFFERENTIAL/PLATELET
Absolute Monocytes: 627 cells/uL (ref 200–950)
Basophils Absolute: 51 cells/uL (ref 0–200)
Basophils Relative: 0.9 %
Eosinophils Absolute: 268 cells/uL (ref 15–500)
Eosinophils Relative: 4.7 %
HCT: 41.3 % (ref 35.0–45.0)
Hemoglobin: 13.8 g/dL (ref 11.7–15.5)
Lymphs Abs: 1186 cells/uL (ref 850–3900)
MCH: 29.7 pg (ref 27.0–33.0)
MCHC: 33.4 g/dL (ref 32.0–36.0)
MCV: 88.8 fL (ref 80.0–100.0)
MPV: 11.7 fL (ref 7.5–12.5)
Monocytes Relative: 11 %
Neutro Abs: 3568 cells/uL (ref 1500–7800)
Neutrophils Relative %: 62.6 %
Platelets: 208 10*3/uL (ref 140–400)
RBC: 4.65 10*6/uL (ref 3.80–5.10)
RDW: 12.5 % (ref 11.0–15.0)
Total Lymphocyte: 20.8 %
WBC: 5.7 10*3/uL (ref 3.8–10.8)

## 2021-06-14 LAB — LIPID PANEL W/REFLEX DIRECT LDL
Cholesterol: 239 mg/dL — ABNORMAL HIGH (ref <200)
HDL: 81 mg/dL (ref >=50)
LDL Cholesterol (Calc): 138 mg/dL (calc) — ABNORMAL HIGH
Non-HDL Cholesterol (Calc): 158 mg/dL (calc) — ABNORMAL HIGH (ref <130)
Total CHOL/HDL Ratio: 3 (calc) (ref <5.0)
Triglycerides: 94 mg/dL (ref <150)

## 2021-06-14 LAB — VITAMIN D 25 HYDROXY (VIT D DEFICIENCY, FRACTURES): Vit D, 25-Hydroxy: 39 ng/mL (ref 30–100)

## 2021-07-12 DIAGNOSIS — K1370 Unspecified lesions of oral mucosa: Secondary | ICD-10-CM | POA: Diagnosis not present

## 2021-08-06 DIAGNOSIS — L820 Inflamed seborrheic keratosis: Secondary | ICD-10-CM | POA: Diagnosis not present

## 2021-08-06 DIAGNOSIS — L570 Actinic keratosis: Secondary | ICD-10-CM | POA: Diagnosis not present

## 2021-08-21 DIAGNOSIS — Z01818 Encounter for other preprocedural examination: Secondary | ICD-10-CM | POA: Diagnosis not present

## 2021-09-17 DIAGNOSIS — L720 Epidermal cyst: Secondary | ICD-10-CM | POA: Diagnosis not present

## 2021-09-21 DIAGNOSIS — Z8616 Personal history of COVID-19: Secondary | ICD-10-CM | POA: Diagnosis not present

## 2021-09-21 DIAGNOSIS — Z791 Long term (current) use of non-steroidal anti-inflammatories (NSAID): Secondary | ICD-10-CM | POA: Diagnosis not present

## 2021-09-21 DIAGNOSIS — Z9889 Other specified postprocedural states: Secondary | ICD-10-CM | POA: Diagnosis not present

## 2021-09-21 DIAGNOSIS — N393 Stress incontinence (female) (male): Secondary | ICD-10-CM | POA: Diagnosis not present

## 2021-10-04 DIAGNOSIS — K1370 Unspecified lesions of oral mucosa: Secondary | ICD-10-CM | POA: Diagnosis not present

## 2021-10-04 DIAGNOSIS — H6123 Impacted cerumen, bilateral: Secondary | ICD-10-CM | POA: Diagnosis not present

## 2021-11-05 DIAGNOSIS — L820 Inflamed seborrheic keratosis: Secondary | ICD-10-CM | POA: Diagnosis not present

## 2021-11-12 DIAGNOSIS — M1990 Unspecified osteoarthritis, unspecified site: Secondary | ICD-10-CM | POA: Diagnosis not present

## 2021-11-12 DIAGNOSIS — R69 Illness, unspecified: Secondary | ICD-10-CM | POA: Diagnosis not present

## 2021-11-12 DIAGNOSIS — E785 Hyperlipidemia, unspecified: Secondary | ICD-10-CM | POA: Diagnosis not present

## 2021-11-12 DIAGNOSIS — Z880 Allergy status to penicillin: Secondary | ICD-10-CM | POA: Diagnosis not present

## 2021-11-12 DIAGNOSIS — Z8249 Family history of ischemic heart disease and other diseases of the circulatory system: Secondary | ICD-10-CM | POA: Diagnosis not present

## 2021-11-12 DIAGNOSIS — M792 Neuralgia and neuritis, unspecified: Secondary | ICD-10-CM | POA: Diagnosis not present

## 2021-11-12 DIAGNOSIS — R030 Elevated blood-pressure reading, without diagnosis of hypertension: Secondary | ICD-10-CM | POA: Diagnosis not present

## 2021-11-12 DIAGNOSIS — Z8349 Family history of other endocrine, nutritional and metabolic diseases: Secondary | ICD-10-CM | POA: Diagnosis not present

## 2021-11-12 DIAGNOSIS — Z8261 Family history of arthritis: Secondary | ICD-10-CM | POA: Diagnosis not present

## 2022-02-19 DIAGNOSIS — L570 Actinic keratosis: Secondary | ICD-10-CM | POA: Diagnosis not present

## 2022-02-19 DIAGNOSIS — L400 Psoriasis vulgaris: Secondary | ICD-10-CM | POA: Diagnosis not present

## 2022-02-19 DIAGNOSIS — L720 Epidermal cyst: Secondary | ICD-10-CM | POA: Diagnosis not present

## 2022-02-19 DIAGNOSIS — L820 Inflamed seborrheic keratosis: Secondary | ICD-10-CM | POA: Diagnosis not present

## 2022-02-19 DIAGNOSIS — D235 Other benign neoplasm of skin of trunk: Secondary | ICD-10-CM | POA: Diagnosis not present

## 2022-02-19 DIAGNOSIS — L579 Skin changes due to chronic exposure to nonionizing radiation, unspecified: Secondary | ICD-10-CM | POA: Diagnosis not present

## 2022-02-19 DIAGNOSIS — L814 Other melanin hyperpigmentation: Secondary | ICD-10-CM | POA: Diagnosis not present

## 2022-02-21 ENCOUNTER — Other Ambulatory Visit: Payer: Self-pay | Admitting: Family Medicine

## 2022-02-21 DIAGNOSIS — M19049 Primary osteoarthritis, unspecified hand: Secondary | ICD-10-CM

## 2022-04-01 ENCOUNTER — Other Ambulatory Visit: Payer: Self-pay | Admitting: Family Medicine

## 2022-04-01 DIAGNOSIS — Z1231 Encounter for screening mammogram for malignant neoplasm of breast: Secondary | ICD-10-CM

## 2022-05-15 ENCOUNTER — Ambulatory Visit (INDEPENDENT_AMBULATORY_CARE_PROVIDER_SITE_OTHER): Payer: Medicare HMO

## 2022-05-15 DIAGNOSIS — Z1231 Encounter for screening mammogram for malignant neoplasm of breast: Secondary | ICD-10-CM

## 2022-06-05 DIAGNOSIS — H2513 Age-related nuclear cataract, bilateral: Secondary | ICD-10-CM | POA: Diagnosis not present

## 2022-06-05 DIAGNOSIS — H524 Presbyopia: Secondary | ICD-10-CM | POA: Diagnosis not present

## 2022-06-20 ENCOUNTER — Encounter: Payer: Medicare HMO | Admitting: Family Medicine

## 2022-06-27 DIAGNOSIS — H2513 Age-related nuclear cataract, bilateral: Secondary | ICD-10-CM | POA: Diagnosis not present

## 2022-06-27 DIAGNOSIS — H2512 Age-related nuclear cataract, left eye: Secondary | ICD-10-CM | POA: Diagnosis not present

## 2022-06-27 DIAGNOSIS — H04123 Dry eye syndrome of bilateral lacrimal glands: Secondary | ICD-10-CM | POA: Diagnosis not present

## 2022-07-10 ENCOUNTER — Encounter: Payer: Self-pay | Admitting: Family Medicine

## 2022-07-10 ENCOUNTER — Ambulatory Visit (INDEPENDENT_AMBULATORY_CARE_PROVIDER_SITE_OTHER): Payer: Medicare HMO | Admitting: Family Medicine

## 2022-07-10 VITALS — BP 108/68 | HR 81 | Ht 64.0 in | Wt 127.8 lb

## 2022-07-10 DIAGNOSIS — Z0000 Encounter for general adult medical examination without abnormal findings: Secondary | ICD-10-CM

## 2022-07-10 DIAGNOSIS — M810 Age-related osteoporosis without current pathological fracture: Secondary | ICD-10-CM | POA: Diagnosis not present

## 2022-07-10 DIAGNOSIS — E785 Hyperlipidemia, unspecified: Secondary | ICD-10-CM | POA: Diagnosis not present

## 2022-07-10 DIAGNOSIS — E559 Vitamin D deficiency, unspecified: Secondary | ICD-10-CM

## 2022-07-10 NOTE — Progress Notes (Signed)
Connie Burke - 77 y.o. female MRN [id number]  Date of birth: 1945-07-01  Subjective Chief Complaint  Patient presents with   Annual Exam    HPI Connie Burke is a 77 year old female here today for annual exam.  She reports she is doing pretty well at this time.  Denies new concerns today.  She remains in fairly good health.  She remains pretty active.  She feels like her diet is fairly healthy.  She is a non-smoker.  Denies alcohol use.  She does have regular dental care.  Review of Systems  Constitutional:  Negative for chills, fever, malaise/fatigue and weight loss.  HENT:  Negative for congestion, ear pain and sore throat.   Eyes:  Negative for blurred vision, double vision and pain.  Respiratory:  Negative for cough and shortness of breath.   Cardiovascular:  Negative for chest pain and palpitations.  Gastrointestinal:  Negative for abdominal pain, blood in stool, constipation, heartburn and nausea.  Genitourinary:  Negative for dysuria and urgency.  Musculoskeletal:  Negative for joint pain and myalgias.  Neurological:  Negative for dizziness and headaches.  Endo/Heme/Allergies:  Does not bruise/bleed easily.  Psychiatric/Behavioral:  Negative for depression. The patient is not nervous/anxious and does not have insomnia.     Allergies  Allergen Reactions   Chlorhexidine     Other reaction(s): Other (See Comments) Burning sensation on skin   Amoxicillin     Does not prefer to take due to childs reaction to medication with rash and abdominal pain.     Past Medical History:  Diagnosis Date   Bunion of unspecified foot    COVID-19    Cystocele with prolapse    Hyperlipidemia    Osteopetrosis     Past Surgical History:  Procedure Laterality Date   BREAST BIOPSY     CYSTECTOMY  12/2020   per patient - completed at [facility]   Marital status: Single    Spouse name: Not on file    Number of children: 4   Years of education: 12   Highest education level: Some college, no degree  Occupational History   Occupation: Engineer, materials  Tobacco Use   Smoking status: Never    Passive exposure: Never   Smokeless tobacco: Never  Vaping Use   Vaping Use: Never used  Substance and Sexual Activity   Alcohol use: Not Currently    Alcohol/week: 0.0 standard drinks of alcohol   Drug use: No   Sexual activity: Not Currently  Other Topics Concern   Not on file  Social History Narrative   Walks dog daily everyday. Member of the prayer shawl club at her church.   Social Determinants of Health   Financial Resource Strain: Low Risk  (05/03/2019)   Overall Financial Resource Strain (CARDIA)    Difficulty of Paying Living Expenses: Not hard at all  Food Insecurity: No Food Insecurity (05/03/2019)   Hunger Vital Sign    Worried About Running Out of Food in the Last Year: Never true    Ran Out of Food in the Last Year: Never true  Transportation Needs: No Transportation Needs (05/03/2019)   PRAPARE - Hydrologist (Medical): No    Lack of Transportation (Non-Medical): No  Physical Activity: Sufficiently Active (05/03/2019)   Exercise Vital Sign    Days of Exercise per Week: 7 days    Minutes of Exercise  per Session: 50 min  Stress: No Stress Concern Present (05/03/2019)   [place]    Feeling of Stress : Not at all  Social Connections: Moderately Integrated (05/03/2019)   Social Connection and Isolation Panel [NHANES]    Frequency of Communication with Friends and Family: More than three times a week    Frequency of Social Gatherings with Friends and Family: Once a week    Attends Religious Services: More than 4 times per year    Active Member of Clubs or Organizations: Yes    Attends Music therapist: More than 4 times per year    Marital Status: Never married     Family History  Problem Relation Age of Onset   Hypertension Mother    Arthritis Mother    Alzheimer's disease Mother    Heart disease Mother    Diabetes Father    Alzheimer's disease Father    Hypotension Father    Cancer Maternal Aunt        breast   Breast cancer Maternal Aunt    Colon cancer Neg Hx     Health Maintenance  Topic Date Due   INFLUENZA VACCINE  09/01/2022 (Originally 01/01/2022)   Zoster Vaccines- Shingrix (1 of 2) 10/08/2022 (Originally 03/16/1967)   Pneumonia Vaccine 60+ Years old (1 - PCV) 07/11/2023 (Originally 03/15/2013)   COVID-19 Vaccine (1) 07/27/2023 (Originally 03/15/1951)   Medicare Annual Wellness ([place])  01/02/2024 (Originally 04/07/2021)   MAMMOGRAM  05/16/2023   COLONOSCOPY (Pts 45-43yr Insurance coverage will need to be confirmed)  07/05/2023   DEXA SCAN  Completed   Hepatitis C Screening  Completed   HPV VACCINES  Aged Out   DTaP/Tdap/Td  Discontinued     ----------------------------------------------------------------------------------------------------------------------------------------------------------------------------------------------------------------- Physical Exam BP 108/68 (BP Location: Left Arm, Patient Position: Sitting, Cuff Size: Normal)   Pulse 81   Ht '5\' 4"'$  (1.626 m)   Wt 127 lb 12.8 oz (58 kg)   SpO2 97%   BMI 21.94 kg/m   Physical Exam Constitutional:      General: She is not in acute distress. HENT:     Head: Normocephalic and atraumatic.     Right Ear: Tympanic membrane and ear canal normal.     Left Ear: Tympanic membrane and ear canal normal.     Nose: Nose normal.  Eyes:     General: No scleral icterus.    Conjunctiva/sclera: Conjunctivae normal.  Neck:     Thyroid: No thyromegaly.  Cardiovascular:     Rate and Rhythm: Normal rate and regular rhythm.     Heart sounds: Normal heart sounds.  Pulmonary:     Effort: Pulmonary effort is normal.     Breath sounds: Normal breath sounds.  Abdominal:      General: Bowel sounds are normal. There is no distension.     Palpations: Abdomen is soft.     Tenderness: There is no abdominal tenderness. There is no guarding.  Musculoskeletal:        General: Normal range of motion.     Cervical back: Normal range of motion and neck supple.  Lymphadenopathy:     Cervical: No cervical adenopathy.  Skin:    General: Skin is warm and dry.     Findings: No rash.  Neurological:     General: No focal deficit present.     Mental Status: She is alert and oriented to person, place, and time.     Cranial Nerves: No cranial  nerve deficit.     Coordination: Coordination normal.  Psychiatric:        Mood and Affect: Mood normal.        Behavior: Behavior normal.     ------------------------------------------------------------------------------------------------------------------------------------------------------------------------------------------------------------------- Assessment and Plan  Well adult exam Well adult Orders Placed This Encounter  Procedures   COMPLETE METABOLIC PANEL WITH GFR   CBC with Differential   Lipid Panel w/reflex Direct LDL   Vitamin D (25 hydroxy)  Screenings: Per lab orders Immunizations: Declines at this time Anticipatory guidance/risk factor reduction: Recommendations per AVS.   No orders of the defined types were placed in this encounter.   Return in about 1 year (around 07/11/2023) for Annual exam.    This visit occurred during the SARS-CoV-2 public health emergency.  Safety protocols were in place, including screening questions prior to the visit, additional usage of staff PPE, and extensive cleaning of exam room while observing appropriate contact time as indicated for disinfecting solutions.

## 2022-07-10 NOTE — Patient Instructions (Signed)
Preventive Care 65 Years and Older, Female Preventive care refers to lifestyle choices and visits with your health care provider that can promote health and wellness. Preventive care visits are also called wellness exams. What can I expect for my preventive care visit? Counseling Your health care provider may ask you questions about your: Medical history, including: Past medical problems. Family medical history. Pregnancy and menstrual history. History of falls. Current health, including: Memory and ability to understand (cognition). Emotional well-being. Home life and relationship well-being. Sexual activity and sexual health. Lifestyle, including: Alcohol, nicotine or tobacco, and drug use. Access to firearms. Diet, exercise, and sleep habits. Work and work environment. Sunscreen use. Safety issues such as seatbelt and bike helmet use. Physical exam Your health care provider will check your: Height and weight. These may be used to calculate your BMI (body mass index). BMI is a measurement that tells if you are at a healthy weight. Waist circumference. This measures the distance around your waistline. This measurement also tells if you are at a healthy weight and may help predict your risk of certain diseases, such as type 2 diabetes and high blood pressure. Heart rate and blood pressure. Body temperature. Skin for abnormal spots. What immunizations do I need?  Vaccines are usually given at various ages, according to a schedule. Your health care provider will recommend vaccines for you based on your age, medical history, and lifestyle or other factors, such as travel or where you work. What tests do I need? Screening Your health care provider may recommend screening tests for certain conditions. This may include: Lipid and cholesterol levels. Hepatitis C test. Hepatitis B test. HIV (human immunodeficiency virus) test. STI (sexually transmitted infection) testing, if you are at  risk. Lung cancer screening. Colorectal cancer screening. Diabetes screening. This is done by checking your blood sugar (glucose) after you have not eaten for a while (fasting). Mammogram. Talk with your health care provider about how often you should have regular mammograms. BRCA-related cancer screening. This may be done if you have a family history of breast, ovarian, tubal, or peritoneal cancers. Bone density scan. This is done to screen for osteoporosis. Talk with your health care provider about your test results, treatment options, and if necessary, the need for more tests. Follow these instructions at home: Eating and drinking  Eat a diet that includes fresh fruits and vegetables, whole grains, lean protein, and low-fat dairy products. Limit your intake of foods with high amounts of sugar, saturated fats, and salt. Take vitamin and mineral supplements as recommended by your health care provider. Do not drink alcohol if your health care provider tells you not to drink. If you drink alcohol: Limit how much you have to 0-1 drink a day. Know how much alcohol is in your drink. In the [place] one drink equals one 12 oz bottle of beer (355 mL), one 5 oz glass of wine (148 mL), or one 1 oz glass of hard liquor (44 mL). Lifestyle Brush your teeth every morning and night with fluoride toothpaste. Floss one time each day. Exercise for at least 30 minutes 5 or more days each week. Do not use any products that contain nicotine or tobacco. These products include cigarettes, chewing tobacco, and vaping devices, such as e-cigarettes. If you need help quitting, ask your health care provider. Do not use drugs. If you are sexually active, practice safe sex. Use a condom or other form of protection in order to prevent STIs. Take aspirin only as told by   your health care provider. Make sure that you understand how much to take and what form to take. Work with your health care provider to find out whether it  is safe and beneficial for you to take aspirin daily. Ask your health care provider if you need to take a cholesterol-lowering medicine (statin). Find healthy ways to manage stress, such as: Meditation, yoga, or listening to music. Journaling. Talking to a trusted person. Spending time with friends and family. Minimize exposure to UV radiation to reduce your risk of skin cancer. Safety Always wear your seat belt while driving or riding in a vehicle. Do not drive: If you have been drinking alcohol. Do not ride with someone who has been drinking. When you are tired or distracted. While texting. If you have been using any mind-altering substances or drugs. Wear a helmet and other protective equipment during sports activities. If you have firearms in your house, make sure you follow all gun safety procedures. What's next? Visit your health care provider once a year for an annual wellness visit. Ask your health care provider how often you should have your eyes and teeth checked. Stay up to date on all vaccines. This information is not intended to replace advice given to you by your health care provider. Make sure you discuss any questions you have with your health care provider. Document Revised: 11/15/2020 Document Reviewed: 11/15/2020 Elsevier Patient Education  2023 [employer] Inc.  

## 2022-07-10 NOTE — Assessment & Plan Note (Signed)
Well adult Orders Placed This Encounter  Procedures   COMPLETE METABOLIC PANEL WITH GFR   CBC with Differential   Lipid Panel w/reflex Direct LDL   Vitamin D (25 hydroxy)  Screenings: Per lab orders Immunizations: Declines at this time Anticipatory guidance/risk factor reduction: Recommendations per AVS.

## 2022-07-19 ENCOUNTER — Ambulatory Visit
Admission: EM | Admit: 2022-07-19 | Discharge: 2022-07-19 | Disposition: A | Discharge status: Home / Self Care | Payer: Medicare HMO | Attending: Emergency Medicine | Admitting: Emergency Medicine

## 2022-07-19 ENCOUNTER — Encounter: Payer: Self-pay | Admitting: Emergency Medicine

## 2022-07-19 DIAGNOSIS — J069 Acute upper respiratory infection, unspecified: Secondary | ICD-10-CM

## 2022-07-19 MED ORDER — GUAIFENESIN ER 600 MG PO TB12: 600.0000 mg | ORAL_TABLET | Freq: Two times a day (BID) | ORAL | 0 refills | Status: DC

## 2022-07-19 MED ORDER — DEXTROMETHORPHAN POLISTIREX ER 30 MG/5ML PO SUER: 60.0000 mg | Freq: Two times a day (BID) | ORAL | 0 refills | Status: DC | PRN

## 2022-07-19 NOTE — ED Provider Notes (Signed)
[name]    CSN: [id number] Arrival date & time: 07/19/22  1012      History   Chief Complaint Chief Complaint  Patient presents with   Cough    HPI Connie Burke is a 77 y.o. female.  Presents with 5 day history of congestion, cough No fevers. No GI symptoms Has taken alka seltzer cold Neighbor was sick recently  Denies hx lung problems. 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 Largest 7lbs          Home Medications    Prior to Admission medications   Medication Sig Start Date End Date Taking? Authorizing Provider  Calcium Carbonate (CALCIUM 500 PO) Take by mouth.   Yes [provider]  celecoxib (CELEBREX) 100 MG capsule TAKE ONE CAPSULE BY MOUTH TWICE A DAY AS NEEDED 02/22/22  Yes Luetta Nutting, DO  ciprofloxacin (CILOXAN) 0.3 % ophthalmic solution  06/27/22  Yes [provider]  clobetasol (TEMOVATE) 0.05 % external solution Apply topically. 02/19/22  Yes [provider]  dextromethorphan (DELSYM) 30 MG/5ML liquid Take 10 mLs (60  mg total) by mouth 2 (two) times daily as needed. 07/19/22  Yes Shoichi Mielke, Wells Guiles, PA-C  guaiFENesin (MUCINEX) 600 MG 12 hr tablet Take 1 tablet (600 mg total) by mouth 2 (two) times daily for 5 days. 07/19/22 07/24/22 Yes Sharlie Shreffler, Wells Guiles, PA-C  ketorolac (ACULAR) 0.5 % ophthalmic solution SMARTSIG:In Eye(s) 06/27/22  Yes [provider]  magnesium oxide (MAG-OX) 400 MG tablet Take by mouth.   Yes [provider]  Omega-3 Fatty Acids (FISH OIL) 1200 MG CAPS Take 1 capsule by mouth 2 (two) times a week.   Yes [provider]  prednisoLONE acetate (PRED FORTE) 1 % ophthalmic suspension SMARTSIG:In Eye(s) 06/27/22  Yes [provider]  Vitamin D, Cholecalciferol, 10 MCG (400 UNIT) CAPS Take 2 tablets by mouth daily.   Yes [provider]    Family History Family History  Problem Relation Age of Onset   Hypertension Mother    Arthritis Mother    Alzheimer's disease Mother    Heart disease Mother    Diabetes Father    Alzheimer's disease Father    Hypotension Father  Cancer Maternal Aunt        breast   Breast cancer Maternal Aunt    Colon cancer Neg Hx     Social History Social History   Tobacco Use   Smoking status: Never    Passive exposure: Never   Smokeless tobacco: Never  Vaping Use   Vaping Use: Never used  Substance Use Topics   Alcohol use: Not Currently    Alcohol/week: 0.0 standard drinks of alcohol   Drug use: No     Allergies   Chlorhexidine and Amoxicillin   Review of Systems Review of Systems  Respiratory:  Positive for cough.   As per HPI  Physical Exam Triage Vital Signs ED Triage Vitals  Enc Vitals Group     BP 07/19/22 1022 (!) 147/84     Pulse Rate 07/19/22 1022 83     Resp 07/19/22 1022 18     Temp 07/19/22 1022 98.7 F (37.1 C)     Temp Source 07/19/22 1022 Oral     SpO2 07/19/22 1022 95 %     Weight 07/19/22 1024 127 lb (57.6 kg)     Height 07/19/22 1024 5' 3.5" (1.613 m)     Head Circumference --       Peak Flow --      Pain Score 07/19/22 1024 0     Pain Loc --      Pain Edu? --      Excl. in [place]? --    No data found.  Updated Vital Signs BP (!) 147/84 (BP Location: Right Arm)   Pulse 83   Temp 98.7 F (37.1 C) (Oral)   Resp 18   Ht 5' 3.5" (1.613 m)   Wt 127 lb (57.6 kg)   SpO2 95%   BMI 22.14 kg/m   Physical Exam Vitals and nursing note reviewed.  Constitutional:      General: She is not in acute distress. HENT:     Nose: Congestion present. No rhinorrhea.     Right Turbinates: Swollen.     Left Turbinates: Swollen.     Mouth/Throat:     Mouth: Mucous membranes are moist.     Pharynx: Oropharynx is clear. No posterior oropharyngeal erythema.  Eyes:     Conjunctiva/sclera: Conjunctivae normal.  Cardiovascular:     Rate and Rhythm: Normal rate and regular rhythm.     Pulses: Normal pulses.     Heart sounds: Normal heart sounds.  Pulmonary:     Effort: Pulmonary effort is normal.     Breath sounds: Normal breath sounds.  Musculoskeletal:     Cervical back: Normal range of motion.  Lymphadenopathy:     Cervical: No cervical adenopathy.  Skin:    General: Skin is warm and dry.  Neurological:     Mental Status: She is alert and oriented to person, place, and time.     UC Treatments / Results  Labs (all labs ordered are listed, but only abnormal results are displayed) Labs Reviewed - No data to display  EKG   Radiology No results found.  Procedures Procedures (including critical care time)  Medications Ordered in UC Medications - No data to display  Initial Impression / Assessment and Plan / UC Course  I have reviewed the triage vital signs and the nursing notes.  Pertinent labs & imaging results that were available during my care of the patient were reviewed by me and considered in my medical decision making (see chart for details).  Afebrile, clear lungs. Well appearing Patient does not like to take medication, we discussed treating  symptoms with medication and natural remedies. Patient understands symptoms may linger without medication treatment  Recommend mucinex, delsym, nasal saline Return precautions discussed. Patient agrees to plan  Final Clinical Impressions(s) / UC Diagnoses   Final diagnoses:  Viral URI with cough     Discharge Instructions      Mucinex twice daily. Take with LOTS of fluids! Delsym (cough syrup) twice daily  Try the nasal saline rinse to clear congestion Tylenol if needed for aches     ED Prescriptions     Medication Sig Dispense Auth. Provider   dextromethorphan (DELSYM) 30 MG/5ML liquid Take 10 mLs (60 mg total) by mouth 2 (two) times daily as needed. 89 mL Nesbit Michon, PA-C   guaiFENesin (MUCINEX) 600 MG 12 hr tablet Take 1 tablet (600 mg total) by mouth 2 (two) times daily for 5 days. 10 tablet Price Lachapelle, Wells Guiles, PA-C      PDMP not reviewed this encounter.   Luva Metzger, Wells Guiles, [place] 07/19/22 1102

## 2022-07-19 NOTE — Discharge Instructions (Addendum)
Mucinex twice daily. Take with LOTS of fluids! Delsym (cough syrup) twice daily  Try the nasal saline rinse to clear congestion Tylenol if needed for aches

## 2022-07-19 NOTE — ED Triage Notes (Signed)
Patient c/o cough, congestion, right ear pain x 6 days.  Patient has taken Alka-Seltzer cold.

## 2022-07-20 ENCOUNTER — Telehealth: Payer: Self-pay | Admitting: Emergency Medicine

## 2022-07-20 NOTE — Telephone Encounter (Signed)
Patient states that she's started the medication prescribed yesterday and she's starting to feel better.  Patient will follow up as needed.

## 2022-07-22 ENCOUNTER — Ambulatory Visit
Admission: EM | Admit: 2022-07-22 | Discharge: 2022-07-22 | Discharge status: Against Medical Advice | Payer: Medicare HMO

## 2022-07-23 ENCOUNTER — Ambulatory Visit (INDEPENDENT_AMBULATORY_CARE_PROVIDER_SITE_OTHER): Payer: Medicare HMO | Admitting: Family Medicine

## 2022-07-23 ENCOUNTER — Encounter: Payer: Self-pay | Admitting: Family Medicine

## 2022-07-23 VITALS — BP 122/73 | HR 77 | Temp 98.4°F | Ht 64.0 in | Wt 125.0 lb

## 2022-07-23 DIAGNOSIS — J019 Acute sinusitis, unspecified: Secondary | ICD-10-CM

## 2022-07-23 DIAGNOSIS — R059 Cough, unspecified: Secondary | ICD-10-CM

## 2022-07-23 LAB — POCT INFLUENZA A/B
Influenza A, POC: NEGATIVE
Influenza B, POC: NEGATIVE

## 2022-07-23 LAB — POC COVID19 BINAXNOW: SARS Coronavirus 2 Ag: NEGATIVE

## 2022-07-23 MED ORDER — CEFDINIR 300 MG PO CAPS: 300.0000 mg | ORAL_CAPSULE | Freq: Two times a day (BID) | ORAL | 0 refills | Status: DC

## 2022-07-23 NOTE — Progress Notes (Signed)
Acute Office Visit  Subjective:     Patient ID: Connie Burke, female    DOB: 03-01-1946, 77 y.o.   MRN: [id number]  Chief Complaint  Patient presents with   Cough    HPI Patient is in today for sinus sxs that started about 10 days ago.  She says initially it started with a tickle in her throat and then she started gradually getting worse 2 days after that she developed laryngitis and then started developing more significant nasal congestion.  More recently symptoms have also included irritated watery eyes she says the start to get a little goopy and sticky if she does not keep them cleaned well.  She did go to urgent care on February 16 and was just diagnosed with a viral URI she was encouraged to do a trial of at least some over-the-counter medications including Mucinex which she has been taking.  She is also been using an over-the-counter nasal spray.  ROS      Objective:    BP 122/73   Pulse 77   Temp 98.4 F (36.9 C)   Ht 5' 4"$  (1.626 m)   Wt 125 lb (56.7 kg)   SpO2 99%   BMI 21.46 kg/m    Physical Exam Constitutional:      Appearance: She is well-developed.  HENT:     Head: Normocephalic and atraumatic.     Right Ear: Tympanic membrane, ear canal and external ear normal.     Left Ear: Tympanic membrane, ear canal and external ear normal.     Nose: Nose normal.     Mouth/Throat:     Pharynx: Oropharynx is clear. Posterior oropharyngeal erythema present.  Eyes:     Conjunctiva/sclera: Conjunctivae normal.     Pupils: Pupils are equal, round, and reactive to light.  Neck:     Thyroid: No thyromegaly.  Cardiovascular:     Rate and Rhythm: Normal rate and regular rhythm.     Heart sounds: Normal heart sounds.  Pulmonary:     Effort: Pulmonary effort is normal.     Breath sounds: Normal breath sounds. No wheezing.  Musculoskeletal:     Cervical back: Neck supple.  Lymphadenopathy:     Cervical: No cervical adenopathy.  Skin:    General: Skin is  warm and dry.  Neurological:     Mental Status: She is alert and oriented to person, place, and time.     Results for orders placed or performed in visit on 07/23/22  POC COVID-19  Result Value Ref Range   SARS Coronavirus 2 Ag Negative Negative  POCT Influenza A/B  Result Value Ref Range   Influenza A, POC Negative Negative   Influenza B, POC Negative Negative        Assessment & Plan:   Problem List Items Addressed This Visit   None Visit Diagnoses     Cough, unspecified type    -  Primary   Relevant Orders   POC COVID-19 (Completed)   POCT Influenza A/B (Completed)   Acute non-recurrent sinusitis, unspecified location       Relevant Medications   cefdinir (OMNICEF) 300 MG capsule      Persistent sinusitis symptoms for 2 weeks with out significant improvement.  She is not sure if she can take amoxicillin but it is listed on her intolerance list so we decided to use cefdinir.  She says she is also intolerant to azithromycin she usually can only take it for couple days and has  to stop it.  Call if not better in 1 week.  Negative for flu and COVID.  Meds ordered this encounter  Medications   cefdinir (OMNICEF) 300 MG capsule    Sig: Take 1 capsule (300 mg total) by mouth 2 (two) times daily.    Dispense:  14 capsule    Refill:  0    No follow-ups on file.  Beatrice Lecher, MD

## 2022-07-24 ENCOUNTER — Telehealth: Payer: Self-pay | Admitting: Family Medicine

## 2022-07-24 NOTE — Telephone Encounter (Signed)
[facility] to schedule their annual wellness visit. Appointment made for 08/12/22 at [place] [facility] Direct Dial: [phone]

## 2022-08-05 DIAGNOSIS — L719 Rosacea, unspecified: Secondary | ICD-10-CM | POA: Diagnosis not present

## 2022-08-05 DIAGNOSIS — L981 Factitial dermatitis: Secondary | ICD-10-CM | POA: Diagnosis not present

## 2022-08-05 DIAGNOSIS — M810 Age-related osteoporosis without current pathological fracture: Secondary | ICD-10-CM | POA: Diagnosis not present

## 2022-08-05 DIAGNOSIS — E559 Vitamin D deficiency, unspecified: Secondary | ICD-10-CM | POA: Diagnosis not present

## 2022-08-05 DIAGNOSIS — E785 Hyperlipidemia, unspecified: Secondary | ICD-10-CM | POA: Diagnosis not present

## 2022-08-05 DIAGNOSIS — Z0000 Encounter for general adult medical examination without abnormal findings: Secondary | ICD-10-CM | POA: Diagnosis not present

## 2022-08-05 DIAGNOSIS — L821 Other seborrheic keratosis: Secondary | ICD-10-CM | POA: Diagnosis not present

## 2022-08-06 LAB — CBC WITH DIFFERENTIAL/PLATELET
Absolute Monocytes: 635 cells/uL (ref 200–950)
Basophils Absolute: 69 cells/uL (ref 0–200)
Basophils Relative: 1 %
Eosinophils Absolute: 276 cells/uL (ref 15–500)
Eosinophils Relative: 4 %
HCT: 42.6 % (ref 35.0–45.0)
Hemoglobin: 14.3 g/dL (ref 11.7–15.5)
Lymphs Abs: 1332 cells/uL (ref 850–3900)
MCH: 29.3 pg (ref 27.0–33.0)
MCHC: 33.6 g/dL (ref 32.0–36.0)
MCV: 87.3 fL (ref 80.0–100.0)
MPV: 11.9 fL (ref 7.5–12.5)
Monocytes Relative: 9.2 %
Neutro Abs: 4589 cells/uL (ref 1500–7800)
Neutrophils Relative %: 66.5 %
Platelets: 313 10*3/uL (ref 140–400)
RBC: 4.88 10*6/uL (ref 3.80–5.10)
RDW: 12.4 % (ref 11.0–15.0)
Total Lymphocyte: 19.3 %
WBC: 6.9 10*3/uL (ref 3.8–10.8)

## 2022-08-06 LAB — COMPLETE METABOLIC PANEL WITH GFR
AG Ratio: 1.5 (calc) (ref 1.0–2.5)
ALT: 13 U/L (ref 6–29)
AST: 15 U/L (ref 10–35)
Albumin: 4.6 g/dL (ref 3.6–5.1)
Alkaline phosphatase (APISO): 82 U/L (ref 37–153)
BUN: 18 mg/dL (ref 7–25)
CO2: 28 mmol/L (ref 20–32)
Calcium: 10.4 mg/dL (ref 8.6–10.4)
Chloride: 104 mmol/L (ref 98–110)
Creat: 0.72 mg/dL (ref 0.60–1.00)
Globulin: 3.1 g/dL (calc) (ref 1.9–3.7)
Glucose, Bld: 83 mg/dL (ref 65–99)
Potassium: 4.5 mmol/L (ref 3.5–5.3)
Sodium: 141 mmol/L (ref 135–146)
Total Bilirubin: 0.4 mg/dL (ref 0.2–1.2)
Total Protein: 7.7 g/dL (ref 6.1–8.1)
eGFR: 88 mL/min/{1.73_m2} (ref >=60)

## 2022-08-06 LAB — LIPID PANEL W/REFLEX DIRECT LDL
Cholesterol: 249 mg/dL — ABNORMAL HIGH (ref <200)
HDL: 89 mg/dL (ref >=50)
LDL Cholesterol (Calc): 139 mg/dL (calc) — ABNORMAL HIGH
Non-HDL Cholesterol (Calc): 160 mg/dL (calc) — ABNORMAL HIGH (ref <130)
Total CHOL/HDL Ratio: 2.8 (calc) (ref <5.0)
Triglycerides: 99 mg/dL (ref <150)

## 2022-08-06 LAB — VITAMIN D 25 HYDROXY (VIT D DEFICIENCY, FRACTURES): Vit D, 25-Hydroxy: 57 ng/mL (ref 30–100)

## 2022-08-07 DIAGNOSIS — M1990 Unspecified osteoarthritis, unspecified site: Secondary | ICD-10-CM | POA: Diagnosis not present

## 2022-08-07 DIAGNOSIS — H2512 Age-related nuclear cataract, left eye: Secondary | ICD-10-CM | POA: Diagnosis not present

## 2022-08-07 DIAGNOSIS — K219 Gastro-esophageal reflux disease without esophagitis: Secondary | ICD-10-CM | POA: Diagnosis not present

## 2022-08-07 DIAGNOSIS — H2511 Age-related nuclear cataract, right eye: Secondary | ICD-10-CM | POA: Diagnosis not present

## 2022-08-12 ENCOUNTER — Ambulatory Visit (INDEPENDENT_AMBULATORY_CARE_PROVIDER_SITE_OTHER): Payer: Medicare HMO | Admitting: Family Medicine

## 2022-08-12 DIAGNOSIS — Z780 Asymptomatic menopausal state: Secondary | ICD-10-CM

## 2022-08-12 DIAGNOSIS — Z0000 Encounter for general adult medical examination without abnormal findings: Secondary | ICD-10-CM | POA: Diagnosis not present

## 2022-08-12 NOTE — Patient Instructions (Addendum)
[place] Maintenance Summary and Written Plan of Care  Connie Burke ,  Thank you for allowing me to perform your Medicare Annual Wellness Visit and for your ongoing commitment to your health.   Health Maintenance & Immunization History Health Maintenance  Topic Date Due   INFLUENZA VACCINE  09/01/2022 (Originally 01/01/2022)   Zoster Vaccines- Shingrix (1 of 2) 10/08/2022 (Originally 03/16/1965)   Pneumonia Vaccine 60+ Years old (1 of 1 - PCV) 07/11/2023 (Originally 03/15/2013)   COVID-19 Vaccine (1) 07/27/2023 (Originally 03/15/1951)   MAMMOGRAM  05/16/2023   COLONOSCOPY (Pts 45-20yr Insurance coverage will need to be confirmed)  07/05/2023   Medicare Annual Wellness (AWV)  08/12/2023   DEXA SCAN  Completed   Hepatitis C Screening  Completed   HPV VACCINES  Aged Out   DTaP/Tdap/Td  Discontinued    There is no immunization history on file for this patient.  These are the patient goals that we discussed:  Goals Addressed               This Visit's Progress     Patient Stated (pt-stated)        Patient stated that she would like to start doing more brain stimulating exercises.          This is a list of Health Maintenance Items that are overdue or due now: Pneumococcal vaccine  Influenza vaccine Shingrix vaccine Bone density scan  Patient declined the vaccines  Orders/Referrals Placed Today: Orders Placed This Encounter  Procedures   DEXAScan    Standing Status:   Future    Standing Expiration Date:   08/12/2023    Scheduling Instructions:     Please call patient to schedule.    Order Specific Question:   Reason for exam:    Answer:   post menopausal    Order Specific Question:   Preferred imaging location?    Answer:   [facility]  (Contact our referral department at [phone] you have not spoken with someone about your referral appointment within the next 5 days)    Follow-up Plan Follow-up with MLuetta Nutting DO as planned Medicare wellness visit in one year.  AVS printed and mailed to the patient.     Health Maintenance, Female Adopting a healthy lifestyle and getting preventive care are important in promoting health and wellness. Ask your health care provider about: The right schedule for you to have regular tests and exams. Things you can do on your own to prevent diseases and keep yourself healthy. What should I know about diet, weight, and exercise? Eat a healthy diet  Eat a diet that includes plenty of vegetables, fruits, low-fat dairy products, and lean protein. Do not eat a lot of foods that are high in solid fats, added sugars, or sodium. Maintain a healthy weight Body mass index (BMI) is used to identify weight problems. It estimates body fat based on height and weight. Your health care provider can help determine your BMI and help you achieve or maintain a healthy weight. Get regular exercise Get regular exercise. This is one of the most important things you can do for your health. Most adults should: Exercise for at least 150 minutes each week. The exercise should increase your heart rate and make you sweat (moderate-intensity exercise). Do strengthening exercises at least twice a week. This is in addition to the moderate-intensity exercise. Spend less time sitting. Even light physical activity can be beneficial. Watch cholesterol and blood lipids  Have your blood tested for lipids and cholesterol at 75 years of age, then have this test every 5 years. Have your cholesterol levels checked more often if: Your lipid or cholesterol levels are high. You are older than 75 years of age. You are at high risk for heart disease. What should I know about cancer screening? Depending on your health history and family history, you may need to have cancer screening at various ages. This may include screening for: Breast cancer. Cervical cancer. Colorectal cancer. Skin cancer. Lung  cancer. What should I know about heart disease, diabetes, and high blood pressure? Blood pressure and heart disease High blood pressure causes heart disease and increases the risk of stroke. This is more likely to develop in people who have high blood pressure readings or are overweight. Have your blood pressure checked: Every 3-5 years if you are 42-38 years of age. Every year if you are 76 years old or older. Diabetes Have regular diabetes screenings. This checks your fasting blood sugar level. Have the screening done: Once every three years after age 64 if you are at a normal weight and have a low risk for diabetes. More often and at a younger age if you are overweight or have a high risk for diabetes. What should I know about preventing infection? Hepatitis B If you have a higher risk for hepatitis B, you should be screened for this virus. Talk with your health care provider to find out if you are at risk for hepatitis B infection. Hepatitis C Testing is recommended for: Everyone born from [birth year] through 1963. Anyone with known risk factors for hepatitis C. Sexually transmitted infections (STIs) Get screened for STIs, including gonorrhea and chlamydia, if: You are sexually active and are younger than 75 years of age. You are older than 75 years of age and your health care provider tells you that you are at risk for this type of infection. Your sexual activity has changed since you were last screened, and you are at increased risk for chlamydia or gonorrhea. Ask your health care provider if you are at risk. Ask your health care provider about whether you are at high risk for HIV. Your health care provider may recommend a prescription medicine to help prevent HIV infection. If you choose to take medicine to prevent HIV, you should first get tested for HIV. You should then be tested every 3 months for as long as you are taking the medicine. Pregnancy If you are about to stop having your  period (premenopausal) and you may become pregnant, seek counseling before you get pregnant. Take 400 to 800 micrograms (mcg) of folic acid every day if you become pregnant. Ask for birth control (contraception) if you want to prevent pregnancy. Osteoporosis and menopause Osteoporosis is a disease in which the bones lose minerals and strength with aging. This can result in bone fractures. If you are 70 years old or older, or if you are at risk for osteoporosis and fractures, ask your health care provider if you should: Be screened for bone loss. Take a calcium or vitamin D supplement to lower your risk of fractures. Be given hormone replacement therapy (HRT) to treat symptoms of menopause. Follow these instructions at home: Alcohol use Do not drink alcohol if: Your health care provider tells you not to drink. You are pregnant, may be pregnant, or are planning to become pregnant. If you drink alcohol: Limit how much you have to: 0-1 drink a day. Know how much alcohol  is in your drink. In the [place] one drink equals one 12 oz bottle of beer (355 mL), one 5 oz glass of wine (148 mL), or one 1 oz glass of hard liquor (44 mL). Lifestyle Do not use any products that contain nicotine or tobacco. These products include cigarettes, chewing tobacco, and vaping devices, such as e-cigarettes. If you need help quitting, ask your health care provider. Do not use street drugs. Do not share needles. Ask your health care provider for help if you need support or information about quitting drugs. General instructions Schedule regular health, dental, and eye exams. Stay current with your vaccines. Tell your health care provider if: You often feel depressed. You have ever been abused or do not feel safe at home. Summary Adopting a healthy lifestyle and getting preventive care are important in promoting health and wellness. Follow your health care provider's instructions about healthy diet, exercising, and  getting tested or screened for diseases. Follow your health care provider's instructions on monitoring your cholesterol and blood pressure. This information is not intended to replace advice given to you by your health care provider. Make sure you discuss any questions you have with your health care provider. Document Revised: 10/09/2020 Document Reviewed: 10/09/2020 Elsevier Patient Education  [place] you for enrolling in [facility]. Please follow the instructions below to securely access your online medical record. MyChart allows you to send messages to your doctor, view your test results, manage appointments, and more.   How Do I Sign Up? In your Internet browser, go to [employer] and enter [email or web address]. Click on the Sign Up Now link in the Sign In box. You will see the New Member Sign Up page. Enter your MyChart Access Code exactly as it appears below. You will not need to use this code after you've completed the sign-up process. If you do not sign up before the expiration date, you must request a new code.  MyChart Access Code: [email or web address] Expires: 09/05/2022 10:28 AM  Enter your Social Security Number ([id number]) and Date of Birth (mm/dd/yyyy) as indicated and click Submit. You will be taken to the next sign-up page. Create a MyChart ID. This will be your MyChart login ID and cannot be changed, so think of one that is secure and easy to remember. Create a Scientist, research (physical sciences). You can change your password at any time. Enter your Password Reset Question and Answer. This can be used at a later time if you forget your password.  Enter your e-mail address. You will receive e-mail notification when new information is available in [facility]. Click Sign Up. You can now view your medical record.   Additional Information Remember, MyChart is NOT to be used for urgent needs. For medical emergencies, dial 911.

## 2022-08-12 NOTE — Progress Notes (Signed)
MEDICARE ANNUAL WELLNESS VISIT  08/12/2022  Telephone Visit Disclaimer This Medicare AWV was conducted by telephone due to national recommendations for restrictions regarding the COVID-19 Pandemic (e.g. social distancing).  I verified, using two identifiers, that I am speaking with Connie Burke or their authorized healthcare agent. I discussed the limitations, risks, security, and privacy concerns of performing an evaluation and management service by telephone and the potential availability of an in-person appointment in the future. The patient expressed understanding and agreed to proceed.  Location of Patient: Home Location of Provider (nurse):  In office.  Subjective:    Connie Burke is a 77 y.o. female patient of Connie Nutting, DO who had a Medicare Annual Wellness Visit today via telephone. Latifha is Retired and lives alone. she has 4 children. she reports that she is socially active and does interact with friends/family regularly. she is moderately physically active and enjoys crocheting, reading and hanging out with her children..  Patient Care Team: Connie Nutting, DO as PCP - General (Family Medicine)     08/12/2022    9:20 AM 05/09/2021    3:23 PM 05/03/2019   10:04 AM 08/14/2015   11:57 AM 08/14/2015   11:34 AM  Advanced Directives  Does Patient Have a Medical Advance Directive? Yes Yes Yes Yes Yes  Type of Corporate treasurer of [place];Living will;Out of facility DNR (pink MOST or yellow form) [place];Living will  Living will  Does patient want to make changes to medical advance directive? No - Patient declined No - Patient declined No - Patient declined    Copy of [place] in Chart?  No - copy requested No - copy requested      Hospital Utilization Over the Past 12 Months: # of hospitalizations or ER visits: 0 # of surgeries: 1  Review of Systems    Patient reports that her overall health  is unchanged compared to last year.  History obtained from chart review and the patient  Patient Reported Readings (BP, Pulse, CBG, Weight, etc) none  Pain Assessment Pain : No/denies pain     Current Medications & Allergies (verified) Allergies as of 08/12/2022       Reactions   Chlorhexidine    Other reaction(s): Other (See Comments) Burning sensation on skin   Azithromycin Nausea Only   Amoxicillin    Does not prefer to take due to childs reaction to medication with rash and abdominal pain.         Medication List        Accurate as of August 12, 2022  9:43 AM. If you have any questions, ask your nurse or doctor.          CALCIUM 500 PO Take by mouth.   cefdinir 300 MG capsule Commonly known as: OMNICEF Take 1 capsule (300 mg total) by mouth 2 (two) times daily.   celecoxib 100 MG capsule Commonly known as: CELEBREX TAKE ONE CAPSULE BY MOUTH TWICE A DAY AS NEEDED   ciprofloxacin 0.3 % ophthalmic solution Commonly known as: CILOXAN   clobetasol 0.05 % external solution Commonly known as: TEMOVATE Apply topically.   Fish Oil 1200 MG Caps Take 1 capsule by mouth 2 (two) times a week.   ketorolac 0.5 % ophthalmic solution Commonly known as: ACULAR SMARTSIG:In Eye(s)   magnesium oxide 400 MG tablet Commonly known as: MAG-OX Take by mouth.   prednisoLONE acetate 1 % ophthalmic suspension Commonly known as: PRED  FORTE SMARTSIG:In Eye(s)   Vitamin D (Cholecalciferol) 10 MCG (400 UNIT) Caps Take 2 tablets by mouth daily.        History (reviewed): Past Medical History:  Diagnosis Date   Bunion of unspecified foot    COVID-19    Cystocele with prolapse    Hyperlipidemia    Osteopetrosis    Past Surgical History:  Procedure Laterality Date   BREAST BIOPSY     CATARACT EXTRACTION Left    CYSTECTOMY  12/2020   per patient - completed at [facility]     Family History  Problem Relation Age of Onset   Hypertension Mother     Arthritis Mother    Alzheimer's disease Mother    Heart disease Mother    Diabetes Father    Alzheimer's disease Father    Hypotension Father    Cancer Maternal Aunt        breast   Breast cancer Maternal Aunt    Colon cancer Neg Hx    Social History   Socioeconomic History   Marital status: Divorced    Spouse name: Not on file   Number of children: 4   Years of education: 12   Highest education level: Some college, no degree  Occupational History   Occupation: Engineer, materials   Occupation: Retired  Tobacco Use   Smoking status: Never    Passive exposure: Never   Smokeless tobacco: Never  Vaping Use   Vaping Use: Never used  Substance and Sexual Activity   Alcohol use: Not Currently    Alcohol/week: 0.0 standard drinks of alcohol   Drug use: No   Sexual activity: Not Currently  Other Topics Concern   Not on file  Social History Narrative   Lives alone. She enjoys crocheting, reading and hanging out with her children.   Social Determinants of Health   Financial Resource Strain: Low Risk  (08/12/2022)   Overall Financial Resource Strain (CARDIA)    Difficulty of Paying Living Expenses: Not hard at all  Food Insecurity: No Food Insecurity (08/12/2022)   Hunger Vital Sign    Worried About Running Out of Food in the Last Year: Never true    Ran Out of Food in the Last Year: Never true  Transportation Needs: No Transportation Needs (08/12/2022)   PRAPARE - Hydrologist (Medical): No    Lack of Transportation (Non-Medical): No  Physical Activity: Sufficiently Active (08/12/2022)   Exercise Vital Sign    Days of Exercise per Week: 7 days    Minutes of Exercise per Session: 60 min  Stress: No Stress Concern Present (08/12/2022)   [place]    Feeling of Stress : Not at all  Social Connections: Moderately Integrated (08/12/2022)   Social Connection and Isolation Panel [NHANES]     Frequency of Communication with Friends and Family: More than three times a week    Frequency of Social Gatherings with Friends and Family: More than three times a week    Attends Religious Services: More than 4 times per year    Active Member of [employer] or Organizations: Yes    Attends Archivist Meetings: More than 4 times per year    Marital Status: Divorced    Activities of Daily Living    08/12/2022    9:30 AM  In your present state of health, do you have any difficulty performing the following activities:  Hearing?  1  Comment some hearing loss  Vision? 0  Difficulty concentrating or making decisions? 0  Walking or climbing stairs? 0  Dressing or bathing? 0  Doing errands, shopping? 0  Preparing Food and eating ? N  Using the Toilet? N  In the past six months, have you accidently leaked urine? N  Do you have problems with loss of bowel control? N  Managing your Medications? N  Managing your Finances? N  Housekeeping or managing your Housekeeping? N    Patient Education/ Literacy How often do you need to have someone help you when you read instructions, pamphlets, or other written materials from your doctor or pharmacy?: 1 - Never What is the last grade level you completed in school?: 2 years of college.  Exercise Current Exercise Habits: Home exercise routine, Type of exercise: walking, Time (Minutes): 60, Frequency (Times/Week): 7, Weekly Exercise (Minutes/Week): 420, Intensity: Moderate, Exercise limited by: None identified  Diet Patient reports consuming 2 meals a day and 0-1 snack(s) a day Patient reports that her primary diet is: Regular Patient reports that she does have regular access to food.   Depression Screen    08/12/2022    9:21 AM 07/23/2022   10:19 AM 07/10/2022    2:12 PM 06/13/2021    8:46 AM 05/09/2021    3:23 PM 05/03/2019   10:05 AM 01/08/2019    9:11 AM  PHQ 2/9 Scores  PHQ - 2 Score 0 0 0 0 0 0 0  PHQ- 9 Score     1  0     Fall  Risk    08/12/2022    9:21 AM 07/23/2022   10:19 AM 07/10/2022    2:11 PM 06/13/2021    8:46 AM 05/09/2021    3:23 PM  Fall Risk   Falls in the past year? 0 0 0 0 0  Number falls in past yr: 0 0 0 0 0  Injury with Fall? 0 0 0 0 0  Risk for fall due to :  No Fall Risks No Fall Risks No Fall Risks No Fall Risks  Follow up Falls evaluation completed Falls evaluation completed Falls evaluation completed Falls evaluation completed Falls evaluation completed     Objective:  Kayliann Hrncir seemed alert and oriented and she participated appropriately during our telephone visit.  Blood Pressure Weight BMI  BP Readings from Last 3 Encounters:  07/23/22 122/73  07/19/22 (!) 147/84  07/10/22 108/68   Wt Readings from Last 3 Encounters:  07/23/22 125 lb (56.7 kg)  07/19/22 127 lb (57.6 kg)  07/10/22 127 lb 12.8 oz (58 kg)   BMI Readings from Last 1 Encounters:  07/23/22 21.46 kg/m    *Unable to obtain current vital signs, weight, and BMI due to telephone visit type  Hearing/Vision  Ivanna did not seem to have difficulty with hearing/understanding during the telephone conversation Reports that she has had a formal eye exam by an eye care professional within the past year Reports that she has not had a formal hearing evaluation within the past year *Unable to fully assess hearing and vision during telephone visit type  Cognitive Function:    08/12/2022    9:32 AM 05/03/2019   10:08 AM 10/31/2017    9:47 AM 10/30/2016    9:56 AM  6CIT Screen  What Year? 0 points 0 points 0 points 0 points  What month? 0 points 0 points 0 points 0 points  What time? 0 points 0 points 0 points  0 points  Count back from 20 0 points 0 points 0 points 0 points  Months in reverse 0 points 0 points 0 points 0 points  Repeat phrase 0 points 0 points 2 points 4 points  Total Score 0 points 0 points 2 points 4 points   (Normal:0-7, Significant for Dysfunction: >8)  Normal Cognitive Function Screening:  Yes   Immunization & Health Maintenance Record  There is no immunization history on file for this patient.  Health Maintenance  Topic Date Due   INFLUENZA VACCINE  09/01/2022 (Originally 01/01/2022)   Zoster Vaccines- Shingrix (1 of 2) 10/08/2022 (Originally 03/16/1965)   Pneumonia Vaccine 41+ Years old (1 of 1 - PCV) 07/11/2023 (Originally 03/15/2013)   COVID-19 Vaccine (1) 07/27/2023 (Originally 03/15/1951)   MAMMOGRAM  05/16/2023   COLONOSCOPY (Pts 45-27yr Insurance coverage will need to be confirmed)  07/05/2023   Medicare Annual Wellness (AWV)  08/12/2023   DEXA SCAN  Completed   Hepatitis C Screening  Completed   HPV VACCINES  Aged Out   DTaP/Tdap/Td  Discontinued       Assessment  This is a routine wellness examination for [employer]  Health Maintenance: Due or Overdue There are no preventive care reminders to display for this patient.  CDaryll Drowndoes not need a referral for Community Assistance: Care Management:   no Social Work:    no Prescription Assistance:  no Nutrition/Diabetes Education:  no   Plan:  Personalized Goals  Goals Addressed               This Visit's Progress     Patient Stated (pt-stated)        Patient stated that she would like to start doing more brain stimulating exercises.        Personalized Health Maintenance & Screening Recommendations  Pneumococcal vaccine  Influenza vaccine Shingrix vaccine Bone density scan  Patient declined the vaccines  Lung Cancer Screening Recommended: no (Low Dose CT Chest recommended if Age 75-80years, 30 pack-year currently smoking OR have quit w/in past 15 years) Hepatitis C Screening recommended: no HIV Screening recommended: no  Advanced Directives: Written information was not prepared per patient's request.  Referrals & Orders Orders Placed This Encounter  Procedures   [place]   Follow-up Plan Follow-up with MLuetta Nutting DO as planned Medicare  wellness visit in one year.  AVS printed and mailed to the patient.   I have personally reviewed and noted the following in the patient's chart:   Medical and social history Use of alcohol, tobacco or illicit drugs  Current medications and supplements Functional ability and status Nutritional status Physical activity Advanced directives List of other physicians Hospitalizations, surgeries, and ER visits in previous 12 months Vitals Screenings to include cognitive, depression, and falls Referrals and appointments  In addition, I have reviewed and discussed with CDaryll Drowncertain preventive protocols, quality metrics, and best practice recommendations. A written personalized care plan for preventive services as well as general preventive health recommendations is available and can be mailed to the patient at her request.      BTinnie Gens RN BSN  08/12/2022

## 2022-08-15 DIAGNOSIS — H2512 Age-related nuclear cataract, left eye: Secondary | ICD-10-CM | POA: Diagnosis not present

## 2022-08-20 DIAGNOSIS — H2511 Age-related nuclear cataract, right eye: Secondary | ICD-10-CM | POA: Diagnosis not present

## 2022-08-21 DIAGNOSIS — H2511 Age-related nuclear cataract, right eye: Secondary | ICD-10-CM | POA: Diagnosis not present

## 2022-08-21 DIAGNOSIS — M1990 Unspecified osteoarthritis, unspecified site: Secondary | ICD-10-CM | POA: Diagnosis not present

## 2022-09-02 DIAGNOSIS — H903 Sensorineural hearing loss, bilateral: Secondary | ICD-10-CM | POA: Diagnosis not present

## 2022-09-02 DIAGNOSIS — H90A31 Mixed conductive and sensorineural hearing loss, unilateral, right ear with restricted hearing on the contralateral side: Secondary | ICD-10-CM | POA: Insufficient documentation

## 2022-09-04 ENCOUNTER — Ambulatory Visit
Admission: EM | Admit: 2022-09-04 | Discharge: 2022-09-04 | Disposition: A | Discharge status: Home / Self Care | Payer: Medicare HMO | Attending: Family Medicine | Admitting: Family Medicine

## 2022-09-04 DIAGNOSIS — N390 Urinary tract infection, site not specified: Secondary | ICD-10-CM | POA: Diagnosis not present

## 2022-09-04 LAB — POCT URINALYSIS DIP (MANUAL ENTRY)
Bilirubin, UA: NEGATIVE
Glucose, UA: NEGATIVE mg/dL
Ketones, POC UA: NEGATIVE mg/dL
Nitrite, UA: NEGATIVE
Protein Ur, POC: NEGATIVE mg/dL
Spec Grav, UA: 1.01 (ref 1.010–1.025)
Urobilinogen, UA: 0.2 E.U./dL
pH, UA: 5.5 (ref 5.0–8.0)

## 2022-09-04 MED ORDER — NITROFURANTOIN MONOHYD MACRO 100 MG PO CAPS: 100.0000 mg | ORAL_CAPSULE | Freq: Two times a day (BID) | ORAL | 0 refills | Status: DC

## 2022-09-04 NOTE — ED Provider Notes (Signed)
[facility]    CSN: [id number] Arrival date & time: 09/04/22  0851      History   Chief Complaint Chief Complaint  Patient presents with   Urinary Frequency    HPI Connie Burke is a 77 y.o. female.   HPI  Patient states she has urinary frequency and dysuria that she associates is a urinary tract infection.  It has been going on for 2 days.  She has increased her water intake.  No fever or chills.  No nausea or vomiting.  No flank pain or kidney problems.  Past Medical History:  Diagnosis Date   Bunion of unspecified foot    COVID-19    Cystocele with prolapse    Hyperlipidemia    Osteopetrosis     Patient Active Problem List   Diagnosis Date Noted   Oral lesion 06/13/2021   Well adult exam 06/13/2021   Osteoarthritis 05/09/2021   Weakness of perineal floor 06/22/2019   Dupuytren contracture 04/06/2018   Onychomycosis 04/06/2018   Age spots 10/31/2017   Breast calcification, right 03/05/2017   Seborrheic keratoses 08/14/2015   Precancerous skin lesion 08/14/2015   Osteoporosis 09/15/2014    Past Surgical History:  Procedure Laterality Date   BREAST BIOPSY     CATARACT EXTRACTION Left    CYSTECTOMY  12/2020   per patient - completed at [facility]      OB History     Gravida  4   Para  4   Term  4   Preterm      AB      Living  4      SAB      IAB      Ectopic      Multiple      Live Births           Obstetric Comments  SVD x 4. Largest 7lbs          Home Medications    Prior to Admission medications   Medication Sig Start Date End Date Taking? Authorizing Provider  nitrofurantoin, macrocrystal-monohydrate, (MACROBID) 100 MG capsule Take 1 capsule (100 mg total) by mouth 2 (two) times daily. 09/04/22  Yes Raylene Everts, MD  Calcium Carbonate (CALCIUM 500 PO) Take by mouth.    [provider]  celecoxib (CELEBREX) 100 MG capsule TAKE ONE CAPSULE BY MOUTH TWICE A DAY AS NEEDED  02/22/22   Luetta Nutting, DO  magnesium oxide (MAG-OX) 400 MG tablet Take by mouth.    [provider]  Omega-3 Fatty Acids (FISH OIL) 1200 MG CAPS Take 1 capsule by mouth 2 (two) times a week.    [provider]  Vitamin D, Cholecalciferol, 10 MCG (400 UNIT) CAPS Take 2 tablets by mouth daily.    [provider]    Family History Family History  Problem Relation Age of Onset   Hypertension Mother    Arthritis Mother    Alzheimer's disease Mother    Heart disease Mother    Diabetes Father    Alzheimer's disease Father    Hypotension Father    Cancer Maternal Aunt        breast   Breast cancer Maternal Aunt    Colon cancer Neg Hx     Social History Social History   Tobacco Use   Smoking status: Never    Passive exposure: Never   Smokeless tobacco: Never  Vaping Use   Vaping Use: Never used  Substance Use Topics   Alcohol use: Not Currently    Alcohol/week: 0.0 standard drinks of alcohol   Drug use: No     Allergies   Chlorhexidine, Azithromycin, and Amoxicillin   Review of Systems Review of Systems See HPI  Physical Exam Triage Vital Signs ED Triage Vitals  Enc Vitals Group     BP 09/04/22 0900 125/80     Pulse Rate 09/04/22 0900 70     Resp 09/04/22 0900 17     Temp 09/04/22 0900 98 F (36.7 C)     Temp Source 09/04/22 0900 Oral     SpO2 09/04/22 0900 96 %     Weight --      Height --      Head Circumference --      Peak Flow --      Pain Score 09/04/22 0901 1     Pain Loc --      Pain Edu? --      Excl. in [place]? --    No data found.  Updated Vital Signs BP 125/80 (BP Location: Right Arm)   Pulse 70   Temp 98 F (36.7 C) (Oral)   Resp 17   SpO2 96%       Physical Exam Constitutional:      General: She is not in acute distress.    Appearance: She is well-developed.  HENT:     Head: Normocephalic and atraumatic.  Eyes:     Conjunctiva/sclera: Conjunctivae normal.     Pupils: Pupils are equal, round, and  reactive to light.  Cardiovascular:     Rate and Rhythm: Normal rate.  Pulmonary:     Effort: Pulmonary effort is normal. No respiratory distress.  Abdominal:     General: There is no distension.     Palpations: Abdomen is soft.     Tenderness: There is no right CVA tenderness or left CVA tenderness.  Musculoskeletal:        General: Normal range of motion.     Cervical back: Normal range of motion.  Skin:    General: Skin is warm and dry.  Neurological:     Mental Status: She is alert.      UC Treatments / Results  Labs (all labs ordered are listed, but only abnormal results are displayed) Labs Reviewed  POCT URINALYSIS DIP (MANUAL ENTRY) - Abnormal; Notable for the following components:      Result Value   Clarity, UA cloudy (*)    Blood, UA small (*)    Leukocytes, UA Small (1+) (*)    All other components within normal limits  URINE CULTURE    EKG   Radiology No results found.  Procedures Procedures (including critical care time)  Medications Ordered in UC Medications - No data to display  Initial Impression / Assessment and Plan / UC Course  I have reviewed the triage vital signs and the nursing notes.  Pertinent labs & imaging results that were available during my care of the patient were reviewed by me and considered in my medical decision making (see chart for details).     Final Clinical Impressions(s) / UC Diagnoses   Final diagnoses:  Lower urinary tract infectious disease     Discharge Instructions      Continue to drink lots of water Take the antibiotic 2 x a day  Your urine  has been sent to the lab for culture.   ED Prescriptions     Medication Sig Dispense  Auth. Provider   nitrofurantoin, macrocrystal-monohydrate, (MACROBID) 100 MG capsule Take 1 capsule (100 mg total) by mouth 2 (two) times daily. 10 capsule Raylene Everts, MD      PDMP not reviewed this encounter.   Raylene Everts, MD 09/04/22 1012

## 2022-09-04 NOTE — ED Triage Notes (Signed)
Pt c/o urinary frequency x 2 days.  

## 2022-09-04 NOTE — Discharge Instructions (Signed)
Continue to drink lots of water Take the antibiotic 2 x a day  Your urine  has been sent to the lab for culture.

## 2022-09-06 LAB — URINE CULTURE
Culture: 30000 — AB
Special Requests: NORMAL

## 2022-09-09 ENCOUNTER — Encounter (HOSPITAL_COMMUNITY): Payer: Self-pay | Admitting: Emergency Medicine

## 2022-09-18 ENCOUNTER — Other Ambulatory Visit: Payer: Medicare HMO

## 2022-10-16 ENCOUNTER — Ambulatory Visit: Payer: Medicare HMO

## 2022-11-05 ENCOUNTER — Other Ambulatory Visit: Payer: Self-pay | Admitting: Family Medicine

## 2022-11-05 DIAGNOSIS — M19049 Primary osteoarthritis, unspecified hand: Secondary | ICD-10-CM

## 2022-11-13 ENCOUNTER — Other Ambulatory Visit: Payer: Medicare HMO

## 2022-12-24 ENCOUNTER — Ambulatory Visit
Admission: EM | Admit: 2022-12-24 | Discharge: 2022-12-24 | Disposition: A | Discharge status: Home / Self Care | Payer: Medicare HMO | Attending: Family Medicine | Admitting: Family Medicine

## 2022-12-24 DIAGNOSIS — N3001 Acute cystitis with hematuria: Secondary | ICD-10-CM | POA: Diagnosis not present

## 2022-12-24 DIAGNOSIS — M810 Age-related osteoporosis without current pathological fracture: Secondary | ICD-10-CM | POA: Insufficient documentation

## 2022-12-24 DIAGNOSIS — R300 Dysuria: Secondary | ICD-10-CM | POA: Diagnosis not present

## 2022-12-24 DIAGNOSIS — E785 Hyperlipidemia, unspecified: Secondary | ICD-10-CM | POA: Diagnosis not present

## 2022-12-24 DIAGNOSIS — R350 Frequency of micturition: Secondary | ICD-10-CM | POA: Diagnosis not present

## 2022-12-24 LAB — POCT URINALYSIS DIP (MANUAL ENTRY)
Bilirubin, UA: NEGATIVE
Glucose, UA: NEGATIVE mg/dL
Ketones, POC UA: NEGATIVE mg/dL
Nitrite, UA: NEGATIVE
Protein Ur, POC: NEGATIVE mg/dL
Spec Grav, UA: 1.01 (ref 1.010–1.025)
Urobilinogen, UA: 0.2 E.U./dL
pH, UA: 6 (ref 5.0–8.0)

## 2022-12-24 MED ORDER — NITROFURANTOIN MONOHYD MACRO 100 MG PO CAPS: 100.0000 mg | ORAL_CAPSULE | Freq: Two times a day (BID) | ORAL | 0 refills | Status: DC

## 2022-12-24 NOTE — ED Triage Notes (Signed)
Pt c/o dysuria and urinary frequency since waking up this am.

## 2022-12-24 NOTE — ED Provider Notes (Signed)
[facility]    CSN: [id number] Arrival date & time: 12/24/22  1418      History   Chief Complaint Chief Complaint  Patient presents with   Dysuria   Urinary Frequency    HPI Connie Burke is a 77 y.o. female.    HPI Pleasant 77 year old female presents with dysuria and urinary frequency upon awakening this morning.  PMH significant for osteoporosis and HLD.  Past Medical History:  Diagnosis Date   Bunion of unspecified foot    COVID-19    Cystocele with prolapse    Hyperlipidemia    Osteopetrosis     Patient Active Problem List   Diagnosis Date Noted   Oral lesion 06/13/2021   Well adult exam 06/13/2021   Osteoarthritis 05/09/2021   Weakness of perineal floor 06/22/2019   Dupuytren contracture 04/06/2018   Onychomycosis 04/06/2018   Age spots 10/31/2017   Breast calcification, right 03/05/2017   Seborrheic keratoses 08/14/2015   Precancerous skin lesion 08/14/2015   Osteoporosis 09/15/2014    Past Surgical History:  Procedure Laterality Date   BREAST BIOPSY     CATARACT EXTRACTION Left    CYSTECTOMY  12/2020   per patient - completed at [facility]   TUBAL LIGATION      OB History     Gravida  4   Para  4   Term  4   Preterm      AB      Living  4      SAB      IAB      Ectopic      Multiple      Live Births           Obstetric Comments  SVD x 4. Largest 7lbs          Home Medications    Prior to Admission medications   Medication Sig Start Date End Date Taking? Authorizing Provider  nitrofurantoin, macrocrystal-monohydrate, (MACROBID) 100 MG capsule Take 1 capsule (100 mg total) by mouth 2 (two) times daily. 12/24/22  Yes Trevor Iha, FNP  Calcium Carbonate (CALCIUM 500 PO) Take by mouth.    [provider]  celecoxib (CELEBREX) 100 MG capsule TAKE 1 CAPSULE BY MOUTH TWICE A DAY AS NEEDED 11/05/22   Everrett Coombe, DO  magnesium oxide (MAG-OX) 400 MG tablet Take by mouth.    [provider]  Omega-3 Fatty Acids (FISH OIL) 1200 MG CAPS Take 1 capsule by mouth 2 (two) times a week.    [provider]  Vitamin D, Cholecalciferol, 10 MCG (400 UNIT) CAPS Take 2 tablets by mouth daily.    [provider]    Family History Family History  Problem Relation Age of Onset   Hypertension Mother    Arthritis Mother    Alzheimer's disease Mother    Heart disease Mother    Diabetes Father    Alzheimer's disease Father    Hypotension Father    Cancer Maternal Aunt        breast   Breast cancer Maternal Aunt    Colon cancer Neg Hx     Social History Social History   Tobacco Use   Smoking status: Never    Passive exposure: Never   Smokeless tobacco: Never  Vaping Use   Vaping status: Never Used  Substance Use Topics   Alcohol use: Not Currently    Alcohol/week: 0.0 standard drinks of alcohol   Drug use: No  Allergies   Chlorhexidine, Azithromycin, Bactrim [sulfamethoxazole-trimethoprim], and Amoxicillin   Review of Systems Review of Systems  Genitourinary:  Positive for dysuria and frequency.     Physical Exam Triage Vital Signs ED Triage Vitals  Encounter Vitals Group     BP 12/24/22 1427 (!) 150/80     Systolic BP Percentile --      Diastolic BP Percentile --      Pulse Rate 12/24/22 1427 (!) 58     Resp 12/24/22 1427 17     Temp 12/24/22 1427 97.6 F (36.4 C)     Temp Source 12/24/22 1427 Oral     SpO2 12/24/22 1427 98 %     Weight --      Height --      Head Circumference --      Peak Flow --      Pain Score 12/24/22 1428 2     Pain Loc --      Pain Education --      Exclude from Growth Chart --    No data found.  Updated Vital Signs BP (!) 150/80 (BP Location: Right Arm)   Pulse (!) 58   Temp 97.6 F (36.4 C) (Oral)   Resp 17   SpO2 98%      Physical Exam Vitals and nursing note reviewed.  Constitutional:      Appearance: Normal appearance. She is normal weight.  HENT:     Head: Normocephalic and  atraumatic.     Mouth/Throat:     Mouth: Mucous membranes are moist.     Pharynx: Oropharynx is clear.  Eyes:     Extraocular Movements: Extraocular movements intact.     Conjunctiva/sclera: Conjunctivae normal.     Pupils: Pupils are equal, round, and reactive to light.  Cardiovascular:     Rate and Rhythm: Normal rate and regular rhythm.     Pulses: Normal pulses.     Heart sounds: Normal heart sounds.  Pulmonary:     Effort: Pulmonary effort is normal.     Breath sounds: Normal breath sounds. No wheezing, rhonchi or rales.  Abdominal:     Tenderness: There is no right CVA tenderness or left CVA tenderness.  Musculoskeletal:        General: Normal range of motion.     Cervical back: Normal range of motion and neck supple.  Skin:    General: Skin is warm and dry.  Neurological:     General: No focal deficit present.     Mental Status: She is alert and oriented to person, place, and time. Mental status is at baseline.  Psychiatric:        Mood and Affect: Mood normal.        Behavior: Behavior normal.      UC Treatments / Results  Labs (all labs ordered are listed, but only abnormal results are displayed) Labs Reviewed  POCT URINALYSIS DIP (MANUAL ENTRY) - Abnormal; Notable for the following components:      Result Value   Blood, UA moderate (*)    Leukocytes, UA Small (1+) (*)    All other components within normal limits  URINE CULTURE    EKG   Radiology No results found.  Procedures Procedures (including critical care time)  Medications Ordered in UC Medications - No data to display  Initial Impression / Assessment and Plan / UC Course  I have reviewed the triage vital signs and the nursing notes.  Pertinent labs & imaging results that  were available during my care of the patient were reviewed by me and considered in my medical decision making (see chart for details).     MDM: 1.  Acute cystitis with hematuria-Rx'd Macrobid 100 mg capsule twice daily x  5 days; 2.  Dysuria-UA revealed above, urine culture ordered. Advised patient to take medication as directed with food to completion.  Encouraged increase daily water intake to 64 ounces per day while taking this medication.  Advised we will follow-up with urine culture results once received.  Advised if symptoms worsen and/or unresolved please follow-up with PCP or here for further evaluation.  Patient discharged home, hemodynamically stable. Final Clinical Impressions(s) / UC Diagnoses   Final diagnoses:  Dysuria  Acute cystitis with hematuria     Discharge Instructions      Advised patient to take medication as directed with food to completion.  Encouraged increase daily water intake to 64 ounces per day while taking this medication.  Advised we will follow-up with urine culture results once received.  Advised if symptoms worsen and/or unresolved please follow-up with PCP or here for further evaluation.     ED Prescriptions     Medication Sig Dispense Auth. Provider   nitrofurantoin, macrocrystal-monohydrate, (MACROBID) 100 MG capsule Take 1 capsule (100 mg total) by mouth 2 (two) times daily. 10 capsule Trevor Iha, FNP      PDMP not reviewed this encounter.   Trevor Iha, FNP 12/24/22 [id number]

## 2022-12-24 NOTE — Discharge Instructions (Addendum)
Advised patient to take medication as directed with food to completion.  Encouraged increase daily water intake to 64 ounces per day while taking this medication.  Advised we will follow-up with urine culture results once received.  Advised if symptoms worsen and/or unresolved please follow-up with PCP or here for further evaluation.

## 2022-12-25 LAB — URINE CULTURE: Culture: <10000 — AB

## 2023-02-19 DIAGNOSIS — C44311 Basal cell carcinoma of skin of nose: Secondary | ICD-10-CM | POA: Diagnosis not present

## 2023-02-19 DIAGNOSIS — L579 Skin changes due to chronic exposure to nonionizing radiation, unspecified: Secondary | ICD-10-CM | POA: Diagnosis not present

## 2023-02-19 DIAGNOSIS — L821 Other seborrheic keratosis: Secondary | ICD-10-CM | POA: Diagnosis not present

## 2023-02-19 DIAGNOSIS — L570 Actinic keratosis: Secondary | ICD-10-CM | POA: Diagnosis not present

## 2023-02-19 DIAGNOSIS — D235 Other benign neoplasm of skin of trunk: Secondary | ICD-10-CM | POA: Diagnosis not present

## 2023-02-19 DIAGNOSIS — L814 Other melanin hyperpigmentation: Secondary | ICD-10-CM | POA: Diagnosis not present

## 2023-02-19 DIAGNOSIS — D485 Neoplasm of uncertain behavior of skin: Secondary | ICD-10-CM | POA: Diagnosis not present

## 2023-02-19 DIAGNOSIS — L820 Inflamed seborrheic keratosis: Secondary | ICD-10-CM | POA: Diagnosis not present

## 2023-02-19 DIAGNOSIS — B351 Tinea unguium: Secondary | ICD-10-CM | POA: Diagnosis not present

## 2023-03-31 DIAGNOSIS — C44311 Basal cell carcinoma of skin of nose: Secondary | ICD-10-CM | POA: Diagnosis not present

## 2023-04-14 ENCOUNTER — Other Ambulatory Visit: Payer: Self-pay | Admitting: Family Medicine

## 2023-04-14 DIAGNOSIS — Z1231 Encounter for screening mammogram for malignant neoplasm of breast: Secondary | ICD-10-CM

## 2023-04-15 DIAGNOSIS — Z008 Encounter for other general examination: Secondary | ICD-10-CM | POA: Diagnosis not present

## 2023-05-22 ENCOUNTER — Ambulatory Visit: Payer: Medicare HMO

## 2023-05-22 DIAGNOSIS — Z1231 Encounter for screening mammogram for malignant neoplasm of breast: Secondary | ICD-10-CM | POA: Diagnosis not present

## 2023-09-16 ENCOUNTER — Encounter

## 2023-09-16 ENCOUNTER — Encounter: Admitting: Family Medicine

## 2023-09-22 ENCOUNTER — Ambulatory Visit (INDEPENDENT_AMBULATORY_CARE_PROVIDER_SITE_OTHER): Admitting: Family Medicine

## 2023-09-22 ENCOUNTER — Encounter: Payer: Self-pay | Admitting: Family Medicine

## 2023-09-22 VITALS — BP 115/72 | HR 64 | Ht 62.3 in | Wt 122.5 lb

## 2023-09-22 DIAGNOSIS — Z0000 Encounter for general adult medical examination without abnormal findings: Secondary | ICD-10-CM

## 2023-09-22 DIAGNOSIS — Z13220 Encounter for screening for lipoid disorders: Secondary | ICD-10-CM

## 2023-09-22 DIAGNOSIS — Z1211 Encounter for screening for malignant neoplasm of colon: Secondary | ICD-10-CM

## 2023-09-22 DIAGNOSIS — E559 Vitamin D deficiency, unspecified: Secondary | ICD-10-CM

## 2023-09-22 NOTE — Progress Notes (Signed)
 Connie Burke - 77 y.o. female MRN [id number]  Date of birth: 1945/08/01  Subjective Chief Complaint  Patient presents with   Annual Exam    HPI Connie Burke is a 78 y.o. female here today for annual exam.   She reports that she is doing well.   She continues to stay moderately active.  She feels that her diet is pretty good.   Non-smoker.  No EtOH  She has regular dental care.   Review of Systems  Constitutional:  Negative for chills, fever, malaise/fatigue and weight loss.  HENT:  Negative for congestion, ear pain and sore throat.   Eyes:  Negative for blurred vision, double vision and pain.  Respiratory:  Negative for cough and shortness of breath.   Cardiovascular:  Negative for chest pain and palpitations.  Gastrointestinal:  Negative for abdominal pain, blood in stool, constipation, heartburn and nausea.  Genitourinary:  Negative for dysuria and urgency.  Musculoskeletal:  Negative for joint pain and myalgias.  Neurological:  Negative for dizziness and headaches.  Endo/Heme/Allergies:  Does not bruise/bleed easily.  Psychiatric/Behavioral:  Negative for depression. The patient is not nervous/anxious and does not have insomnia.        Allergies  Allergen Reactions   Chlorhexidine     Other reaction(s): Other (See Comments) Burning sensation on skin   Azithromycin  Nausea Only   Bactrim [Sulfamethoxazole-Trimethoprim] Nausea And Vomiting   Amoxicillin     Does not prefer to take due to childs reaction to medication with rash and abdominal pain.     Past Medical History:  Diagnosis Date   Bunion of unspecified foot    COVID-19    Cystocele with prolapse    Hyperlipidemia    Osteopetrosis     Past Surgical History:  Procedure Laterality Date   BREAST BIOPSY     CATARACT EXTRACTION Left    CYSTECTOMY  12/2020   per patient - completed at [facility]   TUBAL LIGATION      Social History   Socioeconomic History   Marital status: Divorced     Spouse name: Not on file   Number of children: 4   Years of education: 12   Highest education level: Some college, no degree  Occupational History   Occupation: Primary school teacher   Occupation: Retired  Tobacco Use   Smoking status: Never    Passive exposure: Never   Smokeless tobacco: Never  Vaping Use   Vaping status: Never Used  Substance and Sexual Activity   Alcohol use: Not Currently    Alcohol/week: 0.0 standard drinks of alcohol   Drug use: No   Sexual activity: Not Currently  Other Topics Concern   Not on file  Social History Narrative   Lives alone. She enjoys crocheting, reading and hanging out with her children.   Social Drivers of Health   Financial Resource Strain: Medium Risk (09/15/2023)   Overall Financial Resource Strain (CARDIA)    Difficulty of Paying Living Expenses: Somewhat hard  Food Insecurity: Patient Declined (09/15/2023)   Hunger Vital Sign    Worried About Running Out of Food in the Last Year: Patient declined    Ran Out of Food in the Last Year: Patient declined  Transportation Needs: No Transportation Needs (09/15/2023)   PRAPARE - Administrator, Civil Service (Medical): No    Lack of Transportation (Non-Medical): No  Physical Activity: Sufficiently Active (09/15/2023)   Exercise Vital Sign    Days of Exercise per Week: 7  days    Minutes of Exercise per Session: 50 min  Stress: No Stress Concern Present (09/15/2023)   [employer] of Occupational Health - Occupational Stress Questionnaire    Feeling of Stress : Not at all  Social Connections: Moderately Integrated (09/15/2023)   Social Connection and Isolation Panel [NHANES]    Frequency of Communication with Friends and Family: Three times a week    Frequency of Social Gatherings with Friends and Family: Three times a week    Attends Religious Services: More than 4 times per year    Active Member of Clubs or Organizations: Yes    Attends Engineer, structural: More than  4 times per year    Marital Status: Divorced    Family History  Problem Relation Age of Onset   Hypertension Mother    Arthritis Mother    Alzheimer's disease Mother    Heart disease Mother    Diabetes Father    Alzheimer's disease Father    Hypotension Father    Cancer Maternal Aunt        breast   Breast cancer Maternal Aunt    Colon cancer Neg Hx     Health Maintenance  Topic Date Due   COVID-19 Vaccine (1) Never done   Zoster Vaccines- Shingrix (1 of 2) Never done   Pneumonia Vaccine 110+ Years old (1 of 1 - PCV) Never done   Colonoscopy  07/05/2023   Medicare Annual Wellness (AWV)  08/12/2023   INFLUENZA VACCINE  01/02/2024   MAMMOGRAM  05/21/2024   DEXA SCAN  Completed   Hepatitis C Screening  Completed   HPV VACCINES  Aged Out   Meningococcal B Vaccine  Aged Out   DTaP/Tdap/Td  Discontinued     ----------------------------------------------------------------------------------------------------------------------------------------------------------------------------------------------------------------- Physical Exam BP 115/72 (BP Location: Left Arm, Patient Position: Sitting, Cuff Size: Small)   Pulse 64   Ht 5' 2.3" (1.582 m)   Wt 122 lb 8 oz (55.6 kg)   SpO2 96%   BMI 22.19 kg/m   Physical Exam Constitutional:      General: She is not in acute distress. HENT:     Head: Normocephalic and atraumatic.     Right Ear: Tympanic membrane and ear canal normal.     Left Ear: Tympanic membrane and ear canal normal.     Nose: Nose normal.  Eyes:     General: No scleral icterus.    Conjunctiva/sclera: Conjunctivae normal.  Neck:     Thyroid : No thyromegaly.  Cardiovascular:     Rate and Rhythm: Normal rate and regular rhythm.     Heart sounds: Normal heart sounds.  Pulmonary:     Effort: Pulmonary effort is normal.     Breath sounds: Normal breath sounds.  Abdominal:     General: Bowel sounds are normal. There is no distension.     Palpations: Abdomen is  soft.     Tenderness: There is no abdominal tenderness. There is no guarding.  Musculoskeletal:        General: Normal range of motion.     Cervical back: Normal range of motion and neck supple.  Lymphadenopathy:     Cervical: No cervical adenopathy.  Skin:    General: Skin is warm and dry.     Findings: No rash.  Neurological:     General: No focal deficit present.     Mental Status: She is alert and oriented to person, place, and time.     Cranial Nerves: No cranial nerve  deficit.     Coordination: Coordination normal.  Psychiatric:        Mood and Affect: Mood normal.        Behavior: Behavior normal.     ------------------------------------------------------------------------------------------------------------------------------------------------------------------------------------------------------------------- Assessment and Plan  Well adult exam Well adult Orders Placed This Encounter  Procedures   CMP14+EGFR   CBC with Differential/Platelet   Lipid Panel With LDL/HDL Ratio   Vitamin D  (25 hydroxy)   Ambulatory referral to Gastroenterology    Referral Priority:   Routine    Referral Type:   Consultation    Referral Reason:   Specialty Services Required    Referred to Provider:   [facility]    Requested Specialty:   Gastroenterology    Number of Visits Requested:   1  Screenings: Per lab orders Immunizations: Declines at this time Anticipatory guidance/risk factor reduction: Recommendations per AVS.   No orders of the defined types were placed in this encounter.   No follow-ups on file.

## 2023-09-22 NOTE — Patient Instructions (Signed)
 Preventive Care 43 Years and Older, Female Preventive care refers to lifestyle choices and visits with your health care provider that can promote health and wellness. Preventive care visits are also called wellness exams. What can I expect for my preventive care visit? Counseling Your health care provider may ask you questions about your: Medical history, including: Past medical problems. Family medical history. Pregnancy and menstrual history. History of falls. Current health, including: Memory and ability to understand (cognition). Emotional well-being. Home life and relationship well-being. Sexual activity and sexual health. Lifestyle, including: Alcohol, nicotine or tobacco, and drug use. Access to firearms. Diet, exercise, and sleep habits. Work and work Astronomer. Sunscreen use. Safety issues such as seatbelt and bike helmet use. Physical exam Your health care provider will check your: Height and weight. These may be used to calculate your BMI (body mass index). BMI is a measurement that tells if you are at a healthy weight. Waist circumference. This measures the distance around your waistline. This measurement also tells if you are at a healthy weight and may help predict your risk of certain diseases, such as type 2 diabetes and high blood pressure. Heart rate and blood pressure. Body temperature. Skin for abnormal spots. What immunizations do I need?  Vaccines are usually given at various ages, according to a schedule. Your health care provider will recommend vaccines for you based on your age, medical history, and lifestyle or other factors, such as travel or where you work. What tests do I need? Screening Your health care provider may recommend screening tests for certain conditions. This may include: Lipid and cholesterol levels. Hepatitis C test. Hepatitis B test. HIV (human immunodeficiency virus) test. STI (sexually transmitted infection) testing, if you are at  risk. Lung cancer screening. Colorectal cancer screening. Diabetes screening. This is done by checking your blood sugar (glucose) after you have not eaten for a while (fasting). Mammogram. Talk with your health care provider about how often you should have regular mammograms. BRCA-related cancer screening. This may be done if you have a family history of breast, ovarian, tubal, or peritoneal cancers. Bone density scan. This is done to screen for osteoporosis. Talk with your health care provider about your test results, treatment options, and if necessary, the need for more tests. Follow these instructions at home: Eating and drinking  Eat a diet that includes fresh fruits and vegetables, whole grains, lean protein, and low-fat dairy products. Limit your intake of foods with high amounts of sugar, saturated fats, and salt. Take vitamin and mineral supplements as recommended by your health care provider. Do not drink alcohol if your health care provider tells you not to drink. If you drink alcohol: Limit how much you have to 0-1 drink a day. Know how much alcohol is in your drink. In the [place] one drink equals one 12 oz bottle of beer (355 mL), one 5 oz glass of wine (148 mL), or one 1 oz glass of hard liquor (44 mL). Lifestyle Brush your teeth every morning and night with fluoride toothpaste. Floss one time each day. Exercise for at least 30 minutes 5 or more days each week. Do not use any products that contain nicotine or tobacco. These products include cigarettes, chewing tobacco, and vaping devices, such as e-cigarettes. If you need help quitting, ask your health care provider. Do not use drugs. If you are sexually active, practice safe sex. Use a condom or other form of protection in order to prevent STIs. Take aspirin only as told by  your health care provider. Make sure that you understand how much to take and what form to take. Work with your health care provider to find out whether it  is safe and beneficial for you to take aspirin daily. Ask your health care provider if you need to take a cholesterol-lowering medicine (statin). Find healthy ways to manage stress, such as: Meditation, yoga, or listening to music. Journaling. Talking to a trusted person. Spending time with friends and family. Minimize exposure to UV radiation to reduce your risk of skin cancer. Safety Always wear your seat belt while driving or riding in a vehicle. Do not drive: If you have been drinking alcohol. Do not ride with someone who has been drinking. When you are tired or distracted. While texting. If you have been using any mind-altering substances or drugs. Wear a helmet and other protective equipment during sports activities. If you have firearms in your house, make sure you follow all gun safety procedures. What's next? Visit your health care provider once a year for an annual wellness visit. Ask your health care provider how often you should have your eyes and teeth checked. Stay up to date on all vaccines. This information is not intended to replace advice given to you by your health care provider. Make sure you discuss any questions you have with your health care provider. Document Revised: 11/15/2020 Document Reviewed: 11/15/2020 Elsevier Patient Education  2024 [employer].

## 2023-09-22 NOTE — Assessment & Plan Note (Signed)
 Well adult Orders Placed This Encounter  Procedures   CMP14+EGFR   CBC with Differential/Platelet   Lipid Panel With LDL/HDL Ratio   Vitamin D  (25 hydroxy)   Ambulatory referral to Gastroenterology    Referral Priority:   Routine    Referral Type:   Consultation    Referral Reason:   Specialty Services Required    Referred to Provider:   [facility]    Requested Specialty:   Gastroenterology    Number of Visits Requested:   1  Screenings: Per lab orders Immunizations: Declines at this time Anticipatory guidance/risk factor reduction: Recommendations per AVS.

## 2023-09-23 LAB — CBC WITH DIFFERENTIAL/PLATELET
Basophils Absolute: 0.1 10*3/uL (ref 0.0–0.2)
Basos: 1 %
EOS (ABSOLUTE): 0.2 10*3/uL (ref 0.0–0.4)
Eos: 3 %
Hematocrit: 39.6 % (ref 34.0–46.6)
Hemoglobin: 13.2 g/dL (ref 11.1–15.9)
Immature Grans (Abs): 0 10*3/uL (ref 0.0–0.1)
Immature Granulocytes: 0 %
Lymphocytes Absolute: 1.4 10*3/uL (ref 0.7–3.1)
Lymphs: 21 %
MCH: 29.8 pg (ref 26.6–33.0)
MCHC: 33.3 g/dL (ref 31.5–35.7)
MCV: 89 fL (ref 79–97)
Monocytes Absolute: 0.8 10*3/uL (ref 0.1–0.9)
Monocytes: 11 %
Neutrophils Absolute: 4.4 10*3/uL (ref 1.4–7.0)
Neutrophils: 64 %
Platelets: 179 10*3/uL (ref 150–450)
RBC: 4.43 x10E6/uL (ref 3.77–5.28)
RDW: 12.8 % (ref 11.7–15.4)
WBC: 6.9 10*3/uL (ref 3.4–10.8)

## 2023-09-23 LAB — CMP14+EGFR
ALT: 14 IU/L (ref 0–32)
AST: 15 IU/L (ref 0–40)
Albumin: 4.4 g/dL (ref 3.8–4.8)
Alkaline Phosphatase: 90 IU/L (ref 44–121)
BUN/Creatinine Ratio: 30 — ABNORMAL HIGH (ref 12–28)
BUN: 20 mg/dL (ref 8–27)
Bilirubin Total: 0.3 mg/dL (ref 0.0–1.2)
CO2: 26 mmol/L (ref 20–29)
Calcium: 9.6 mg/dL (ref 8.7–10.3)
Chloride: 102 mmol/L (ref 96–106)
Creatinine, Ser: 0.66 mg/dL (ref 0.57–1.00)
Globulin, Total: 2.3 g/dL (ref 1.5–4.5)
Glucose: 72 mg/dL (ref 70–99)
Potassium: 4.3 mmol/L (ref 3.5–5.2)
Sodium: 141 mmol/L (ref 134–144)
Total Protein: 6.7 g/dL (ref 6.0–8.5)
eGFR: 91 mL/min/{1.73_m2} (ref >59)

## 2023-09-23 LAB — LIPID PANEL WITH LDL/HDL RATIO
Cholesterol, Total: 224 mg/dL — ABNORMAL HIGH (ref 100–199)
HDL: 84 mg/dL (ref >39)
LDL Chol Calc (NIH): 122 mg/dL — ABNORMAL HIGH (ref 0–99)
LDL/HDL Ratio: 1.5 ratio (ref 0.0–3.2)
Triglycerides: 102 mg/dL (ref 0–149)
VLDL Cholesterol Cal: 18 mg/dL (ref 5–40)

## 2023-09-23 LAB — VITAMIN D 25 HYDROXY (VIT D DEFICIENCY, FRACTURES): Vit D, 25-Hydroxy: 48.4 ng/mL (ref 30.0–100.0)

## 2023-10-03 ENCOUNTER — Encounter: Payer: Self-pay | Admitting: Family Medicine

## 2023-10-08 DIAGNOSIS — H900 Conductive hearing loss, bilateral: Secondary | ICD-10-CM | POA: Diagnosis not present

## 2023-10-08 DIAGNOSIS — H6123 Impacted cerumen, bilateral: Secondary | ICD-10-CM | POA: Diagnosis not present

## 2023-11-03 ENCOUNTER — Other Ambulatory Visit: Payer: Self-pay | Admitting: Family Medicine

## 2023-11-03 DIAGNOSIS — M19049 Primary osteoarthritis, unspecified hand: Secondary | ICD-10-CM

## 2023-11-05 DIAGNOSIS — L720 Epidermal cyst: Secondary | ICD-10-CM | POA: Diagnosis not present

## 2023-11-05 DIAGNOSIS — L820 Inflamed seborrheic keratosis: Secondary | ICD-10-CM | POA: Diagnosis not present

## 2023-11-25 DIAGNOSIS — L400 Psoriasis vulgaris: Secondary | ICD-10-CM | POA: Diagnosis not present

## 2023-11-25 DIAGNOSIS — K219 Gastro-esophageal reflux disease without esophagitis: Secondary | ICD-10-CM | POA: Diagnosis not present

## 2023-11-25 DIAGNOSIS — Z8616 Personal history of COVID-19: Secondary | ICD-10-CM | POA: Diagnosis not present

## 2023-11-25 DIAGNOSIS — N814 Uterovaginal prolapse, unspecified: Secondary | ICD-10-CM | POA: Diagnosis not present

## 2023-11-25 DIAGNOSIS — Z880 Allergy status to penicillin: Secondary | ICD-10-CM | POA: Diagnosis not present

## 2023-11-25 DIAGNOSIS — Z85828 Personal history of other malignant neoplasm of skin: Secondary | ICD-10-CM | POA: Diagnosis not present

## 2023-11-25 DIAGNOSIS — M1990 Unspecified osteoarthritis, unspecified site: Secondary | ICD-10-CM | POA: Diagnosis not present

## 2023-11-25 DIAGNOSIS — Z8249 Family history of ischemic heart disease and other diseases of the circulatory system: Secondary | ICD-10-CM | POA: Diagnosis not present

## 2023-11-25 DIAGNOSIS — N393 Stress incontinence (female) (male): Secondary | ICD-10-CM | POA: Diagnosis not present

## 2023-11-25 DIAGNOSIS — N182 Chronic kidney disease, stage 2 (mild): Secondary | ICD-10-CM | POA: Diagnosis not present

## 2023-11-25 DIAGNOSIS — M810 Age-related osteoporosis without current pathological fracture: Secondary | ICD-10-CM | POA: Diagnosis not present

## 2024-04-28 ENCOUNTER — Other Ambulatory Visit: Payer: Self-pay | Admitting: Internal Medicine

## 2024-04-28 DIAGNOSIS — Z1231 Encounter for screening mammogram for malignant neoplasm of breast: Secondary | ICD-10-CM

## 2024-05-31 ENCOUNTER — Ambulatory Visit (HOSPITAL_BASED_OUTPATIENT_CLINIC_OR_DEPARTMENT_OTHER)
Admission: RE | Admit: 2024-05-31 | Discharge: 2024-05-31 | Disposition: A | Discharge status: Home / Self Care | Source: Ambulatory Visit | Attending: Internal Medicine | Admitting: Internal Medicine

## 2024-05-31 DIAGNOSIS — Z1231 Encounter for screening mammogram for malignant neoplasm of breast: Secondary | ICD-10-CM | POA: Insufficient documentation
